# Patient Record
Sex: Male | Born: 1970 | Race: White | Hispanic: No | Smoking: Current every day smoker
Health system: Southern US, Community
[De-identification: ages and names within clinical notes are randomized; demographics above are authoritative.]

## PROBLEM LIST (undated history)

## (undated) DIAGNOSIS — Z87828 Personal history of other (healed) physical injury and trauma: Secondary | ICD-10-CM

## (undated) DIAGNOSIS — S065X9A Traumatic subdural hemorrhage with loss of consciousness of unspecified duration, initial encounter: Secondary | ICD-10-CM

## (undated) DIAGNOSIS — J189 Pneumonia, unspecified organism: Secondary | ICD-10-CM

## (undated) DIAGNOSIS — X789XXA Intentional self-harm by unspecified sharp object, initial encounter: Secondary | ICD-10-CM

## (undated) DIAGNOSIS — S060X9A Concussion with loss of consciousness of unspecified duration, initial encounter: Secondary | ICD-10-CM

## (undated) DIAGNOSIS — S060XAA Concussion with loss of consciousness status unknown, initial encounter: Secondary | ICD-10-CM

## (undated) DIAGNOSIS — G7111 Myotonic muscular dystrophy: Secondary | ICD-10-CM

## (undated) HISTORY — PX: INCISION AND DRAINAGE PERIRECTAL ABSCESS: SHX1804

---

## 2010-10-10 ENCOUNTER — Emergency Department (HOSPITAL_COMMUNITY)
Admission: EM | Admit: 2010-10-10 | Discharge: 2010-10-10 | Payer: Self-pay | Source: Home / Self Care | Admitting: Emergency Medicine

## 2011-02-17 DIAGNOSIS — S065XAA Traumatic subdural hemorrhage with loss of consciousness status unknown, initial encounter: Secondary | ICD-10-CM

## 2011-02-17 DIAGNOSIS — S065X9A Traumatic subdural hemorrhage with loss of consciousness of unspecified duration, initial encounter: Secondary | ICD-10-CM

## 2011-02-17 HISTORY — DX: Traumatic subdural hemorrhage with loss of consciousness status unknown, initial encounter: S06.5XAA

## 2011-02-17 HISTORY — DX: Traumatic subdural hemorrhage with loss of consciousness of unspecified duration, initial encounter: S06.5X9A

## 2011-03-18 ENCOUNTER — Emergency Department (HOSPITAL_COMMUNITY): Payer: Self-pay

## 2011-03-18 ENCOUNTER — Emergency Department (HOSPITAL_COMMUNITY)
Admission: EM | Admit: 2011-03-18 | Discharge: 2011-03-18 | Disposition: A | Discharge status: Other Acute Inpatient Hospital | Payer: Self-pay | Source: Home / Self Care | Attending: Emergency Medicine | Admitting: Emergency Medicine

## 2011-03-18 ENCOUNTER — Inpatient Hospital Stay (HOSPITAL_COMMUNITY)
Admission: EM | Admit: 2011-03-18 | Discharge: 2011-03-23 | DRG: 087 | Disposition: A | Discharge status: Home / Self Care | Payer: Self-pay | Attending: Emergency Medicine | Admitting: Emergency Medicine

## 2011-03-18 DIAGNOSIS — I252 Old myocardial infarction: Secondary | ICD-10-CM

## 2011-03-18 DIAGNOSIS — F17200 Nicotine dependence, unspecified, uncomplicated: Secondary | ICD-10-CM | POA: Diagnosis present

## 2011-03-18 DIAGNOSIS — S06300A Unspecified focal traumatic brain injury without loss of consciousness, initial encounter: Principal | ICD-10-CM | POA: Diagnosis present

## 2011-03-18 DIAGNOSIS — S02109A Fracture of base of skull, unspecified side, initial encounter for closed fracture: Principal | ICD-10-CM | POA: Diagnosis present

## 2011-03-18 LAB — DIFFERENTIAL
Basophils Relative: 0 % (ref 0–1)
Eosinophils Relative: 0 % (ref 0–5)
Lymphocytes Relative: 12 % (ref 12–46)
Lymphs Abs: 1.3 10*3/uL (ref 0.7–4.0)
Monocytes Relative: 6 % (ref 3–12)

## 2011-03-18 LAB — BASIC METABOLIC PANEL
GFR calc Af Amer: >60 mL/min (ref >60)
Sodium: 140 mEq/L (ref 135–145)

## 2011-03-18 LAB — URINALYSIS, ROUTINE W REFLEX MICROSCOPIC
Protein, ur: NEGATIVE mg/dL
Specific Gravity, Urine: 1.022 (ref 1.005–1.030)
Urobilinogen, UA: 1 mg/dL (ref 0.0–1.0)
pH: 5.5 (ref 5.0–8.0)

## 2011-03-18 LAB — CBC
HCT: 43.9 % (ref 39.0–52.0)
Hemoglobin: 14.1 g/dL (ref 13.0–17.0)
MCHC: 32.1 g/dL (ref 30.0–36.0)
MCV: 89 fL (ref 78.0–100.0)
Platelets: 199 10*3/uL (ref 150–400)
RDW: 13.5 % (ref 11.5–15.5)

## 2011-03-18 LAB — MRSA PCR SCREENING: MRSA by PCR: NEGATIVE

## 2011-03-19 ENCOUNTER — Inpatient Hospital Stay (HOSPITAL_COMMUNITY): Payer: Self-pay
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
## 2011-03-22 ENCOUNTER — Inpatient Hospital Stay (HOSPITAL_COMMUNITY): Payer: Self-pay

## 2011-03-23 ENCOUNTER — Inpatient Hospital Stay (HOSPITAL_COMMUNITY): Payer: Self-pay
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
## 2012-01-06 ENCOUNTER — Inpatient Hospital Stay (HOSPITAL_COMMUNITY)
Admission: EM | Admit: 2012-01-06 | Discharge: 2012-01-14 | DRG: 193 | Disposition: A | Discharge status: Home / Self Care | Payer: Self-pay | Attending: Internal Medicine | Admitting: Internal Medicine

## 2012-01-06 ENCOUNTER — Emergency Department (HOSPITAL_COMMUNITY): Payer: Self-pay

## 2012-01-06 ENCOUNTER — Encounter (HOSPITAL_COMMUNITY): Payer: Self-pay

## 2012-01-06 ENCOUNTER — Other Ambulatory Visit: Payer: Self-pay

## 2012-01-06 DIAGNOSIS — J189 Pneumonia, unspecified organism: Principal | ICD-10-CM | POA: Diagnosis present

## 2012-01-06 DIAGNOSIS — R0902 Hypoxemia: Secondary | ICD-10-CM | POA: Diagnosis present

## 2012-01-06 DIAGNOSIS — J9600 Acute respiratory failure, unspecified whether with hypoxia or hypercapnia: Secondary | ICD-10-CM | POA: Diagnosis present

## 2012-01-06 DIAGNOSIS — Z720 Tobacco use: Secondary | ICD-10-CM

## 2012-01-06 DIAGNOSIS — Z560 Unemployment, unspecified: Secondary | ICD-10-CM

## 2012-01-06 DIAGNOSIS — J479 Bronchiectasis, uncomplicated: Secondary | ICD-10-CM | POA: Diagnosis present

## 2012-01-06 DIAGNOSIS — Z6826 Body mass index (BMI) 26.0-26.9, adult: Secondary | ICD-10-CM

## 2012-01-06 DIAGNOSIS — F17200 Nicotine dependence, unspecified, uncomplicated: Secondary | ICD-10-CM | POA: Diagnosis present

## 2012-01-06 HISTORY — DX: Concussion with loss of consciousness status unknown, initial encounter: S06.0XAA

## 2012-01-06 HISTORY — DX: Pneumonia, unspecified organism: J18.9

## 2012-01-06 HISTORY — DX: Traumatic subdural hemorrhage with loss of consciousness of unspecified duration, initial encounter: S06.5X9A

## 2012-01-06 HISTORY — DX: Concussion with loss of consciousness of unspecified duration, initial encounter: S06.0X9A

## 2012-01-06 LAB — DIFFERENTIAL
Basophils Absolute: 0 10*3/uL (ref 0.0–0.1)
Eosinophils Relative: 1 % (ref 0–5)
Lymphocytes Relative: 10 % — ABNORMAL LOW (ref 12–46)
Neutro Abs: 7.3 10*3/uL (ref 1.7–7.7)

## 2012-01-06 LAB — CBC
HCT: 36.9 % — ABNORMAL LOW (ref 39.0–52.0)
Hemoglobin: 12.6 g/dL — ABNORMAL LOW (ref 13.0–17.0)
RBC: 4.16 MIL/uL — ABNORMAL LOW (ref 4.22–5.81)
WBC: 9.5 10*3/uL (ref 4.0–10.5)

## 2012-01-06 LAB — COMPREHENSIVE METABOLIC PANEL
ALT: 41 U/L (ref 0–53)
AST: 49 U/L — ABNORMAL HIGH (ref 0–37)
Albumin: 3 g/dL — ABNORMAL LOW (ref 3.5–5.2)
CO2: 30 mEq/L (ref 19–32)
Calcium: 9.4 mg/dL (ref 8.4–10.5)
GFR calc non Af Amer: >90 mL/min (ref >90)
Sodium: 141 mEq/L (ref 135–145)
Total Protein: 7.3 g/dL (ref 6.0–8.3)

## 2012-01-06 LAB — INFLUENZA PANEL BY PCR (TYPE A & B): H1N1 flu by pcr: NOT DETECTED

## 2012-01-06 LAB — MAGNESIUM: Magnesium: 2.1 mg/dL (ref 1.5–2.5)

## 2012-01-06 MED ORDER — ACETAMINOPHEN 500 MG PO TABS
1000.0000 mg | ORAL_TABLET | ORAL | Status: AC
  Administered 2012-01-06: 1000 mg via ORAL
  Filled 2012-01-06: qty 2

## 2012-01-06 MED ORDER — GUAIFENESIN-DM 100-10 MG/5ML PO SYRP
5.0000 mL | ORAL_SOLUTION | ORAL | Status: DC | PRN
  Administered 2012-01-07: 5 mL via ORAL
  Filled 2012-01-06 (×2): qty 5

## 2012-01-06 MED ORDER — DEXTROSE 5 % IV SOLN
500.0000 mg | INTRAVENOUS | Status: DC
  Administered 2012-01-07: 500 mg via INTRAVENOUS
  Filled 2012-01-06 (×2): qty 500

## 2012-01-06 MED ORDER — ACETAMINOPHEN 325 MG PO TABS: 650.0000 mg | ORAL_TABLET | Freq: Four times a day (QID) | ORAL | Status: DC | PRN

## 2012-01-06 MED ORDER — CEFTRIAXONE SODIUM 1 G IJ SOLR
1.0000 g | INTRAMUSCULAR | Status: DC
  Administered 2012-01-07 – 2012-01-08 (×2): 1 g via INTRAVENOUS
  Filled 2012-01-06 (×2): qty 10

## 2012-01-06 MED ORDER — ONDANSETRON HCL 4 MG/2ML IJ SOLN
4.0000 mg | Freq: Four times a day (QID) | INTRAMUSCULAR | Status: DC | PRN
  Administered 2012-01-07: 4 mg via INTRAVENOUS
  Filled 2012-01-06: qty 2

## 2012-01-06 MED ORDER — DEXTROSE 5 % IV SOLN
500.0000 mg | Freq: Once | INTRAVENOUS | Status: AC
  Administered 2012-01-06: 500 mg via INTRAVENOUS
  Filled 2012-01-06: qty 500

## 2012-01-06 MED ORDER — NICOTINE 21 MG/24HR TD PT24
21.0000 mg | MEDICATED_PATCH | TRANSDERMAL | Status: DC
  Administered 2012-01-09: 21 mg via TRANSDERMAL
  Filled 2012-01-06 (×9): qty 1

## 2012-01-06 MED ORDER — ENOXAPARIN SODIUM 40 MG/0.4ML ~~LOC~~ SOLN
40.0000 mg | SUBCUTANEOUS | Status: DC
  Administered 2012-01-06 – 2012-01-14 (×8): 40 mg via SUBCUTANEOUS
  Filled 2012-01-06 (×10): qty 0.4

## 2012-01-06 MED ORDER — ONDANSETRON HCL 4 MG/2ML IJ SOLN
4.0000 mg | Freq: Once | INTRAMUSCULAR | Status: AC
  Administered 2012-01-06: 4 mg via INTRAVENOUS
  Filled 2012-01-06: qty 2

## 2012-01-06 MED ORDER — ACETAMINOPHEN 650 MG RE SUPP: 650.0000 mg | Freq: Four times a day (QID) | RECTAL | Status: DC | PRN

## 2012-01-06 MED ORDER — DEXTROSE 5 % IV SOLN
1.0000 g | Freq: Once | INTRAVENOUS | Status: AC
  Administered 2012-01-06: 1 g via INTRAVENOUS
  Filled 2012-01-06: qty 10

## 2012-01-06 MED ORDER — ALBUTEROL SULFATE (5 MG/ML) 0.5% IN NEBU
2.5000 mg | INHALATION_SOLUTION | Freq: Four times a day (QID) | RESPIRATORY_TRACT | Status: DC | PRN
  Administered 2012-01-07: 2.5 mg via RESPIRATORY_TRACT

## 2012-01-06 MED ORDER — SODIUM CHLORIDE 0.9 % IV BOLUS (SEPSIS)
1000.0000 mL | Freq: Once | INTRAVENOUS | Status: AC
  Administered 2012-01-06: 1000 mL via INTRAVENOUS

## 2012-01-06 MED ORDER — ONDANSETRON HCL 4 MG PO TABS: 4.0000 mg | ORAL_TABLET | Freq: Four times a day (QID) | ORAL | Status: DC | PRN

## 2012-01-06 MED ORDER — SODIUM CHLORIDE 0.9 % IV SOLN
INTRAVENOUS | Status: DC
  Administered 2012-01-06: 20:00:00 via INTRAVENOUS
  Administered 2012-01-07: 100 mL/h via INTRAVENOUS
  Administered 2012-01-07 – 2012-01-08 (×2): via INTRAVENOUS

## 2012-01-06 NOTE — ED Notes (Addendum)
Pt was brought in by EMS with cough x 1 week and fever x 2 days. Pt claimed that he has been coughing up yellow phlegm. Pt also c/o SOB when coughing. Pt was given albuterol updraft treatment PTA

## 2012-01-06 NOTE — ED Notes (Signed)
Dinner tray ordered, regular diet 

## 2012-01-06 NOTE — ED Notes (Signed)
[id number] Ready

## 2012-01-06 NOTE — ED Provider Notes (Signed)
History   Nicholas Hanson is a 41 y/o M with no significant PMH who presents to the ED via ambulance for cough, fever, and SOB. The cough began 1 wk ago, is non productive, and occurs constantly throughout the day and night. It has gotten worse throughout the week, and he has not tried any medications. While he has had 4 episodes of bronchitis in the past, he has never had coughing like this episode. After coughing "episodes" he sometimes vomits and has a difficult time catching his breath. Vomiting is usually after coughing episodes, but he states he has vomiting without cough several times. Denies nausea, abd pain,or diarrhea. Fever began 2 days ago, Tmax at home 102. He lives at a shelter and states several people are sick with similar symptoms, but he is the worst. ROS + body aches (2 days), vertigo and dizziness when standing quickly, and sweating at night. Negative for headache and rash. His appetite is decreased, but he drinks liquids well. History obtained from Nicholas Hanson and his wife. Prior to this week, he was in his regular state of health.    CSN: [id number]  Arrival date & time 01/06/12  1191   First MD Initiated Contact with Patient 01/06/12 [phone]      Chief Complaint  Patient presents with  . Cough  . Fever    (Consider location/radiation/quality/duration/timing/severity/associated sxs/prior treatment) HPI  History reviewed. No pertinent past medical history.  History reviewed. No pertinent past surgical history.  No family history on file.  History  Substance Use Topics  . Smoking status: Current Everyday Smoker -- 1.0 packs/day  . Smokeless tobacco: Not on file  . Alcohol Use: No  Nicholas Hanson lives at a shelter and is currently homeless. He denies drinking, ever drinking or drug use. He smokes 1 pk a day, but since this illness has decreased to 6 cig a day    Review of Systems  Constitutional: Positive for fever, diaphoresis and appetite change. Negative for activity  change.  HENT: Negative for congestion and neck pain.   Eyes: Negative for pain.  Respiratory: Positive for cough and shortness of breath. Negative for choking, chest tightness, wheezing and stridor.   Cardiovascular: Negative for chest pain and leg swelling.  Gastrointestinal: Negative for abdominal distention.  Genitourinary: Negative for dysuria.  Musculoskeletal: Positive for arthralgias. Negative for back pain.  Neurological: Positive for dizziness and light-headedness. Negative for headaches.  Hematological: Negative for adenopathy.    Allergies  Review of patient's allergies indicates no known allergies.  Home Medications  No current outpatient prescriptions on file.  BP 127/65  Pulse 104  Temp(Src) 102.3 F (39.1 C) (Oral)  Resp 25  Ht 5\' 10"  (1.778 m)  Wt 182 lb (82.555 kg)  BMI 26.11 kg/m2  SpO2 95%  Physical Exam  Constitutional: He is oriented to person, place, and time. He appears well-developed.  HENT:  Head: Normocephalic.  Mouth/Throat: Mucous membranes are dry.  Eyes: Pupils are equal, round, and reactive to light.  Neck: Normal range of motion. No JVD present.  Cardiovascular: Normal rate, regular rhythm and normal heart sounds.  Exam reveals no gallop and no friction rub.   No murmur heard. Pulmonary/Chest: No stridor. Tachypnea noted. No respiratory distress. He has decreased breath sounds in the right upper field, the right middle field and the right lower field. He has no wheezes. He has rhonchi in the right lower field. He has no rales.  Abdominal: Soft. He exhibits no distension. There is no  tenderness.  Musculoskeletal: Normal range of motion.  Lymphadenopathy:    He has no cervical adenopathy.  Neurological: He is alert and oriented to person, place, and time.  Skin: Skin is warm and dry.  Psychiatric: His behavior is normal.    ED Course  Procedures (including critical care time) In the ED Nicholas Hanson was coughing, febrile, had O2  saturations in the high 80's with good wave form which was abnormal, tachycardic to 100's with BP 115/70's. In general he did not appear well. He was placed on [place] O2 and 1 L NS bolus was given and HR improved to 70/s. To r/u PNA (bacterial vs viral) and TB CXR was obtained. It was concerning for bilateral interstitial nodules consistent with atypical pna. I independently review the film. CBC, CMP, and flu test were ordered. Tylenol was given for fever. Ceftriaxone and Azithromycin started for empiric bacterial coverage of CAP.       Labs Reviewed  CBC  DIFFERENTIAL  COMPREHENSIVE METABOLIC PANEL   No results found.   No diagnosis found.    MDM  In the ED Nicholas Hanson was coughing, febrile with O2 saturations in the high 80's with good wave form. [place] O2 and 1 L NS bolus was given, and the patient improved. To r/u PNA (bacterial vs viral) and TB CXR was obtained. Low risk for PE per Wells criteria, so PE considered less likely.  CXR concerning for PNA, empiric bacterial coverage started (Ceftriaxone and Azithro). Other labs were unremarkable. Internal Medicine was called for admission        Gerhard Munch, MD 01/06/12 1305
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
## 2012-01-06 NOTE — ED Notes (Signed)
Dinner tray delivered.

## 2012-01-07 LAB — BASIC METABOLIC PANEL
BUN: 5 mg/dL — ABNORMAL LOW (ref 6–23)
Calcium: 9 mg/dL (ref 8.4–10.5)
Creatinine, Ser: 0.69 mg/dL (ref 0.50–1.35)
GFR calc Af Amer: >90 mL/min (ref >90)
GFR calc non Af Amer: >90 mL/min (ref >90)

## 2012-01-07 LAB — EXPECTORATED SPUTUM ASSESSMENT W GRAM STAIN, RFLX TO RESP C

## 2012-01-07 LAB — CBC
MCHC: 32.4 g/dL (ref 30.0–36.0)
RDW: 13 % (ref 11.5–15.5)

## 2012-01-07 LAB — LEGIONELLA ANTIGEN, URINE

## 2012-01-07 MED ORDER — PANTOPRAZOLE SODIUM 40 MG PO TBEC
40.0000 mg | DELAYED_RELEASE_TABLET | Freq: Every day | ORAL | Status: DC
  Administered 2012-01-08 – 2012-01-14 (×7): 40 mg via ORAL
  Filled 2012-01-07 (×7): qty 1

## 2012-01-07 MED ORDER — PREDNISONE 20 MG PO TABS
40.0000 mg | ORAL_TABLET | Freq: Two times a day (BID) | ORAL | Status: DC
  Administered 2012-01-07 – 2012-01-08 (×2): 40 mg via ORAL
  Filled 2012-01-07 (×5): qty 2

## 2012-01-07 MED ORDER — MORPHINE SULFATE 2 MG/ML IJ SOLN
0.5000 mg | Freq: Once | INTRAMUSCULAR | Status: AC
  Administered 2012-01-07: 0.5 mg via INTRAVENOUS

## 2012-01-07 MED ORDER — PANTOPRAZOLE SODIUM 40 MG IV SOLR
40.0000 mg | Freq: Once | INTRAVENOUS | Status: AC
  Administered 2012-01-07: 40 mg via INTRAVENOUS

## 2012-01-07 MED ORDER — ALBUTEROL SULFATE (5 MG/ML) 0.5% IN NEBU
INHALATION_SOLUTION | RESPIRATORY_TRACT | Status: AC
  Administered 2012-01-07: 2.5 mg via RESPIRATORY_TRACT
  Filled 2012-01-07: qty 0.5

## 2012-01-07 MED ORDER — SULFAMETHOXAZOLE-TMP DS 800-160 MG PO TABS
2.0000 | ORAL_TABLET | Freq: Three times a day (TID) | ORAL | Status: DC
  Administered 2012-01-07 – 2012-01-08 (×3): 2 via ORAL
  Filled 2012-01-07 (×7): qty 2

## 2012-01-07 MED ORDER — MORPHINE SULFATE 2 MG/ML IJ SOLN
INTRAMUSCULAR | Status: AC
  Administered 2012-01-07: 0.5 mg via INTRAVENOUS
  Filled 2012-01-07: qty 1

## 2012-01-07 MED ORDER — ALBUTEROL SULFATE (5 MG/ML) 0.5% IN NEBU
2.5000 mg | INHALATION_SOLUTION | RESPIRATORY_TRACT | Status: DC
  Administered 2012-01-07 – 2012-01-08 (×5): 2.5 mg via RESPIRATORY_TRACT
  Filled 2012-01-07 (×5): qty 0.5

## 2012-01-07 NOTE — Progress Notes (Signed)
O2 saturation 87%.  Nasal O2 on patient.  Oxygen increased to 3l/m and patient began heaving in the bathroom.  MD notified.  RN caring for patient giving Zofran IV to patient.  MD to see patient shortly.

## 2012-01-07 NOTE — H&P (Addendum)
Please see Dr Rowe Robert H&P for additional details.  Mr Nicholas Hanson is a 41 yo gentleman with no sig PMHx. He currently resides at [place] with his new wife. He had rather abrupt onset (one week) of URI sxs. He now has 2 days of fever, myalgias, and productive cough. Before getting ill, he has able to walk miles without DOE or having to stop to rest. He told Dr Scot Dock that he was last tested for HIV and was negative a few years ago and then told me he was negative 9 months ago. He states only two sexual contacts recently, neither of whom have HIV.   He met his new (2nd) wife three weeks ago and they married two weeks ago. 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 Is not working with anyone to get housing but plans to go to [place] in few weeks when wife can travel (??) where they can get some sort of housing for 3 months.   On exam : he desat down to 80's. O2 by [place] increased. Hoarse. Able to speak in full sentences.  HRRR L crackles B, even anteriorly. Neuro : A&O, no focal  CXR diffuse, B interstitial infiltrates.  WBC 9.5 despite Temp of 102.3 Alk phos 166  1. PNA - we are emperically treating for bacterial. Due to hypoxia & CXR appearance I will start Bactrim and Prednisone while checking HIV status. PT denies HIV RF but want to verify. Blood cxs, sputum gram stain and cxs, legionella, and strep. Temp has defervesce but still hypoxic. Will narrow tx as soon as test results in.  2. Tobacco use - cessation counseling  3. Homelessness - social work consult

## 2012-01-07 NOTE — Progress Notes (Signed)
71- RN to bedside during rounds.  Patient sleeping, sats 87% on 2 L [place].  Increased o2 to 3L, sats rose to 91%.  However, at this, patient began coughing violently, tore off 02, and began heaving in the bathroom.  Unable to place o2 back on patient as patient refusing.  Patient agitated, charging through room, demanding something for his cough.  MD paged.  Zofran given for heaving.  Morley Kos 01/07/2012

## 2012-01-07 NOTE — Progress Notes (Signed)
Medical Student Daily Progress Note  Subjective: The patient had a coughing fit this morning, and desatted down to 87% while on 2L of oxygen.  When increased to 3L by nasal canuli, he apparently started having a coughing fit again. He denies chest pain, but states this uncontrollable coughing spell is like it was at home, with spit up that is clear/foamy. He is tossing and turning, getting up and sitting down and coughing rather uncontrollably when we talk to him in his room.  Denies CP.  Per nursing, the patient was charging across the room during this coughing fit.  He did not receive any breathing treatment or mucinex overnight.  His symptoms improved with nebs and was satting 95% on room air after tx.  Objective: Vital signs in last 24 hours: Filed Vitals:   01/06/12 2100 01/07/12 0500 01/07/12 0800 01/07/12 0856  BP: 108/66 101/58    Pulse: 72 69 58   Temp: 97.7 F (36.5 C) 98.9 F (37.2 C)    TempSrc: Oral Oral    Resp: 20 18    Height:      Weight:      SpO2: 87% 87% 91% 95%   Weight change:   Intake/Output Summary (Last 24 hours) at 01/07/12 1036 Last data filed at 01/07/12 6213  Gross per 24 hour  Intake   1391 ml  Output    450 ml  Net    941 ml   Physical Exam: BP 101/58  Pulse 58  Temp(Src) 98.9 F (37.2 C) (Oral)  Resp 18  Ht 5\' 10"  (1.778 m)  Wt 84.1 kg (185 lb 6.5 oz)  BMI 26.60 kg/m2  SpO2 95%  General Appearance:    Alert, in acute distress, appears stated age  Head:    Normocephalic, without obvious abnormality, atraumatic     Ears:    Left TM is sclerosed.  Nose:   Nares normal, septum midline, mucosa normal, no drainage    or sinus tenderness  Throat:   Lips, mucosa, and tongue normal; teeth and gums normal  Neck:   Supple, symmetrical, trachea midline, no adenopathy;       thyroid:  No enlargement/tenderness/nodules; no carotid   bruit or JVD  Back:     Symmetric, no curvature, ROM normal, no CVA tenderness  Lungs:     Labored breathing,  expiratory wheezing bilaterally, no crackles, rales on exam  Chest wall:    No tenderness or deformity  Heart:    Regular rate and rhythm, S1 and S2 normal, no murmur, rub   or gallop  Abdomen:     Soft, non-tender, bowel sounds active all four quadrants,    no masses, no organomegaly  Genitalia:    Normal male without lesion, discharge or tenderness  Rectal:    Normal tone, normal prostate, no masses or tenderness;   guaiac negative stool  Extremities:   Extremities normal, atraumatic, no cyanosis or edema  Pulses:   2+ and symmetric all extremities  Skin:   Skin color, texture, turgor normal, no rashes or lesions  Lymph nodes:   Cervical, supraclavicular, and axillary nodes normal  Neurologic:   CNII-XII intact. Normal strength, sensation and reflexes      throughout   Lab Results: Basic Metabolic Panel:  Lab 01/07/12 0865 01/06/12 2150 01/06/12 0828  NA 143 -- 141  K 4.4 -- 4.5  CL 109 -- 104  CO2 28 -- 30  GLUCOSE 91 -- 115*  BUN 5* -- 8  CREATININE  0.69 -- 0.78  CALCIUM 9.0 -- 9.4  MG -- 2.1 --  PHOS -- -- --   Liver Function Tests:  Lab 01/06/12 0828  AST 49*  ALT 41  ALKPHOS 166*  BILITOT 0.7  PROT 7.3  ALBUMIN 3.0*   No results found for this basename: LIPASE:2,AMYLASE:2 in the last 168 hours No results found for this basename: AMMONIA:2 in the last 168 hours CBC:  Lab 01/07/12 0630 01/06/12 0828  WBC 9.6 9.5  NEUTROABS -- 7.3  HGB 11.0* 12.6*  HCT 33.9* 36.9*  MCV 90.4 88.7  PLT 226 232   Cardiac Enzymes: No results found for this basename: CKTOTAL:3,CKMB:3,CKMBINDEX:3,TROPONINI:3 in the last 168 hours BNP: No results found for this basename: PROBNP:3 in the last 168 hours D-Dimer: No results found for this basename: DDIMER:2 in the last 168 hours CBG: No results found for this basename: GLUCAP:6 in the last 168 hours Hemoglobin A1C: No results found for this basename: HGBA1C in the last 168 hours Fasting Lipid Panel: No results found for this  basename: CHOL,HDL,LDLCALC,TRIG,CHOLHDL,LDLDIRECT in the last 161 hours Thyroid Function Tests: No results found for this basename: TSH,T4TOTAL,FREET4,T3FREE,THYROIDAB in the last 168 hours Coagulation: No results found for this basename: LABPROT:4,INR:4 in the last 168 hours Anemia Panel: No results found for this basename: VITAMINB12,FOLATE,FERRITIN,TIBC,IRON,RETICCTPCT in the last 168 hours Urine Drug Screen: Drugs of Abuse  No results found for this basename: labopia,  cocainscrnur,  labbenz,  amphetmu,  thcu,  labbarb    Alcohol Level: No results found for this basename: ETH:2 in the last 168 hours Urinalysis: No results found for this basename: COLORURINE:2,APPERANCEUR:2,LABSPEC:2,PHURINE:2,GLUCOSEU:2,HGBUR:2,BILIRUBINUR:2,KETONESUR:2,PROTEINUR:2,UROBILINOGEN:2,NITRITE:2,LEUKOCYTESUR:2 in the last 168 hours Misc. Labs:  Micro Results: Recent Results (from the past 240 hour(s))  CULTURE, SPUTUM-ASSESSMENT     Status: Normal   Collection Time   01/07/12  1:00 AM      Component Value Range Status Comment   Specimen Description SPUTUM   Final    Special Requests NONE   Final    Sputum evaluation     Final    Value: THIS SPECIMEN IS ACCEPTABLE. RESPIRATORY CULTURE REPORT TO FOLLOW.   Report Status 01/07/2012 FINAL   Final   CULTURE, RESPIRATORY     Status: Normal (Preliminary result)   Collection Time   01/07/12  1:00 AM      Component Value Range Status Comment   Specimen Description SPUTUM   Final    Special Requests NONE   Final    Gram Stain     Final    Value: NO WBC SEEN     RARE SQUAMOUS EPITHELIAL CELLS PRESENT     NO ORGANISMS SEEN   Culture PENDING   Incomplete    Report Status PENDING   Incomplete    Studies/Results: Dg Chest 2 View  01/06/2012  *RADIOLOGY REPORT*  Clinical Data: Cough, congestion, fever  CHEST - 2 VIEW  Comparison: None.  Findings: There are coarsely prominent interstitial markings and some nodularity throughout the mid and lower lung fields.   This may represent pneumonia possibly viral or atypical.  No effusion is seen.  The heart is within normal limits in size.  No bony abnormality is noted.  IMPRESSION: Prominent coarse interstitial markings with some nodularity suspicious for pneumonia possibly atypical or viral.  Original Report Authenticated By: Juline Patch, M.D.   Medications: I have reviewed the patient's current medications. Scheduled Meds:    . albuterol  2.5 mg Nebulization Q4H  . azithromycin  500 mg Intravenous  Once  . azithromycin  500 mg Intravenous Q24H  . cefTRIAXone (ROCEPHIN)  IV  1 g Intravenous Q24H  . enoxaparin  40 mg Subcutaneous Q24H  .  morphine injection  0.5 mg Intravenous Once  . nicotine  21 mg Transdermal Q24H  . pantoprazole  40 mg Oral Q1200  . pantoprazole (PROTONIX) IV  40 mg Intravenous Once  . predniSONE  40 mg Oral BID WC  . sulfamethoxazole-trimethoprim  2 tablet Oral Q8H   Continuous Infusions:    . sodium chloride 100 mL/hr at 01/07/12 0625   PRN Meds:.acetaminophen, acetaminophen, guaiFENesin-dextromethorphan, ondansetron (ZOFRAN) IV, ondansetron, DISCONTD: albuterol  Assessment/Plan: Problem list.  Community acquired Pneumonia  Fever  Hypoalbuminemia (3.0)  Mild transaminitis (AST 49)  Alk phos of 166  Tobacco Abuse   Mr. Rueth is a 41 year old who presents with cough with sputum production, fever of >102 and a physical exam and chest xray consistent with findings of community acquired pneumonia. This is likely community acquired viral vs. Atypical pneumonia, but the differential includes bacterial pneumonia, TB (due to housing in group [facility]), and bronchitis. Also PCP is possible given CXR appearance.  # Pneumonia, Community Acquired - The presentation is most likely c/w CAP, but we can not rule out PCP as the patient seems to have worsening oxygen saturation, elevated alkaline phosphatase and non-specific CXR.  - Started Q4H nebs for  hypoxia - Started on Bactrim 2 tabs TID for PCP risk, started on Prednisone 40mg  BID - HIV Pending - Started on Protonix 40mg  QD - Antibiotic therapy - IV Ceftriaxone 1g QD and IV Azithromycin 500mg  QD - 2L O2 by nasal canuli to maintain oxygen saturation over 92% - Influenza panel negative - Legionella urine antigen pending - Strep pneumonia antigen negative - Albuterol PRN wheezing/SOB - BMET and CBC tomorrow AM - Blood cultures pending (post ABx tx) - Vitals per floor  # Fever - The patient's fever is likely due to the CAP, and it has resolved this morning - pt afebrile   - Tylenol PRN fever >101.  # Tobacco Abuse - Counseled on smoking cessation, although patient remains adament that he will continue smoking.   - Nicotine patch, consult for cessation.  # Diet/Fluids - Regular Diet, 100cc/hr NS  # VTE prohpylaxis - Lovenox QD  # Disposition - If symptoms don't improve, consider moving the patient to stepdown.  SW called (Thanks Trula Ore!) and is able to provide info on local resources for the patient given his current homeless status.   LOS: 1 day   This is a Psychologist, occupational Note.  The care of the patient was discussed with Dr. Scot Dock and the assessment and plan formulated with their assistance.  Please see their attached note for official documentation of the daily encounter.  Nicholas Hanson 01/07/2012, 10:36 AM    Resident Co-sign Daily Note: I have seen the patient and reviewed the daily progress note by MS 4 and discussed the care of the patient with them.  See below for documentation of my findings, assessment, and plans.  Subjective: Coughing spell this am with O2 sat 87%. No chest pain or other complaint. Has not received nebs since last night    Objective: Vital signs in last 24 hours: Filed Vitals:   01/06/12 2100 01/07/12 0500 01/07/12 0800 01/07/12 0856  BP: 108/66 101/58    Pulse: 72 69 58   Temp: 97.7 F (36.5 C) 98.9 F (37.2 C)  TempSrc: Oral Oral    Resp: 20 18    Height:      Weight:      SpO2: 87% 87% 91% 95%   Physical Exam:  Gen- agitated, respi distress from coughing and sputum production CVS- tachycardia Respi- b/l equal, mild wheezing on upper lung fields Abd- s/nt/nd Ext- no edema Neuro- non focal  Lab Results: Reviewed and documented in Electronic Record Micro Results: Reviewed and documented in Electronic Record Studies/Results: Reviewed and documented in Electronic Record Medications: I have reviewed the patient's current medications. Scheduled Meds:   . albuterol  2.5 mg Nebulization Q4H  . azithromycin  500 mg Intravenous Once  . azithromycin  500 mg Intravenous Q24H  . cefTRIAXone (ROCEPHIN)  IV  1 g Intravenous Q24H  . enoxaparin  40 mg Subcutaneous Q24H  .  morphine injection  0.5 mg Intravenous Once  . nicotine  21 mg Transdermal Q24H  . pantoprazole  40 mg Oral Q1200  . pantoprazole (PROTONIX) IV  40 mg Intravenous Once  . predniSONE  40 mg Oral BID WC  . sulfamethoxazole-trimethoprim  2 tablet Oral Q8H   Continuous Infusions:   . sodium chloride 100 mL/hr at 01/07/12 0625   PRN Meds:.acetaminophen, acetaminophen, guaiFENesin-dextromethorphan, ondansetron (ZOFRAN) IV, ondansetron, DISCONTD: albuterol Assessment/Plan:  Acute episode this am may be mucous plugging vs brochospasm. PCP is another consideration now that patient has not improved. Checking HIV and treating empirically with bactrim and prednsione. Change nebs to q4 schedule. Add ppi. Continue other treatment as before.    LOS: 1 day   Nicholas Hanson 01/07/2012, 11:04 AM

## 2012-01-08 MED ORDER — ALBUTEROL SULFATE (5 MG/ML) 0.5% IN NEBU
2.5000 mg | INHALATION_SOLUTION | Freq: Four times a day (QID) | RESPIRATORY_TRACT | Status: DC
  Administered 2012-01-08 – 2012-01-12 (×19): 2.5 mg via RESPIRATORY_TRACT
  Filled 2012-01-08 (×17): qty 0.5

## 2012-01-08 MED ORDER — ALBUTEROL SULFATE 2 MG/5ML PO SYRP: 2.5000 mg | ORAL_SOLUTION | ORAL | Status: DC | PRN

## 2012-01-08 MED ORDER — LEVOFLOXACIN 750 MG PO TABS
750.0000 mg | ORAL_TABLET | Freq: Every day | ORAL | Status: DC
  Administered 2012-01-08 – 2012-01-14 (×7): 750 mg via ORAL
  Filled 2012-01-08 (×7): qty 1

## 2012-01-08 MED ORDER — ALBUTEROL SULFATE (5 MG/ML) 0.5% IN NEBU: 2.5000 mg | INHALATION_SOLUTION | RESPIRATORY_TRACT | Status: DC | PRN

## 2012-01-08 NOTE — Progress Notes (Signed)
Subjective: He reports having coughing spell last evening that lasting for 1 hour but reports feeling better now. No more coughing spells for now. Denies any SOB, hemoptysis.   Objective: Vital signs in last 24 hours: Filed Vitals:   01/08/12 0051 01/08/12 0455 01/08/12 0500 01/08/12 0733  BP:   123/58   Pulse:   61   Temp:   97.3 F (36.3 C)   TempSrc:   Oral   Resp:   18   Height:      Weight:      SpO2: 93% 88% 94% 95%   Weight change:   Intake/Output Summary (Last 24 hours) at 01/08/12 1610 Last data filed at 01/08/12 0500  Gross per 24 hour  Intake 758.33 ml  Output   1900 ml  Net -1141.67 ml   Physical Exam: BP 123/58  Pulse 61  Temp(Src) 97.3 F (36.3 C) (Oral)  Resp 18  Ht 5\' 10"  (1.778 m)  Wt 185 lb 6.5 oz (84.1 kg)  BMI 26.60 kg/m2  SpO2 95%  General Appearance:    Alert, cooperative, no distress, appears stated age  Head:    Normocephalic, without obvious abnormality, atraumatic  Eyes:    PERRL, conjunctiva/corneas clear, EOM's intact, fundi    benign, both eyes       Ears:    Normal TM's and external ear canals, both ears  Nose:   Nares normal, septum midline, mucosa normal, no drainage    or sinus tenderness  Throat:   Lips, mucosa, and tongue normal; teeth and gums normal  Neck:   Supple, symmetrical, trachea midline, no adenopathy;       thyroid:  No enlargement/tenderness/nodules; no carotid   bruit or JVD  Back:     Symmetric, no curvature, ROM normal, no CVA tenderness  Lungs:     Clear to auscultation bilaterally, respirations unlabored, bilateral basilar crackles present  Chest wall:    No tenderness or deformity  Heart:    Regular rate and rhythm, S1 and S2 normal, no murmur, rub   or gallop  Abdomen:     Soft, non-tender, bowel sounds active all four quadrants,    no masses, no organomegaly  Genitalia:    Normal male without lesion, discharge or tenderness  Rectal:    Normal tone, normal prostate, no masses or tenderness;   guaiac  negative stool  Extremities:   Extremities normal, atraumatic, no cyanosis or edema  Pulses:   2+ and symmetric all extremities  Skin:   Skin color, texture, turgor normal, no rashes or lesions  Lymph nodes:   Cervical, supraclavicular, and axillary nodes normal  Neurologic:   CNII-XII intact. Normal strength, sensation and reflexes      throughout   Lab Results: Basic Metabolic Panel:  Lab 01/07/12 9604 01/06/12 2150 01/06/12 0828  NA 143 -- 141  K 4.4 -- 4.5  CL 109 -- 104  CO2 28 -- 30  GLUCOSE 91 -- 115*  BUN 5* -- 8  CREATININE 0.69 -- 0.78  CALCIUM 9.0 -- 9.4  MG -- 2.1 --  PHOS -- -- --   Liver Function Tests:  Lab 01/06/12 0828  AST 49*  ALT 41  ALKPHOS 166*  BILITOT 0.7  PROT 7.3  ALBUMIN 3.0*   No results found for this basename: LIPASE:2,AMYLASE:2 in the last 168 hours No results found for this basename: AMMONIA:2 in the last 168 hours CBC:  Lab 01/07/12 0630 01/06/12 0828  WBC 9.6 9.5  NEUTROABS --  7.3  HGB 11.0* 12.6*  HCT 33.9* 36.9*  MCV 90.4 88.7  PLT 226 232   Cardiac Enzymes: No results found for this basename: CKTOTAL:3,CKMB:3,CKMBINDEX:3,TROPONINI:3 in the last 168 hours BNP: No results found for this basename: PROBNP:3 in the last 168 hours D-Dimer: No results found for this basename: DDIMER:2 in the last 168 hours CBG: No results found for this basename: GLUCAP:6 in the last 168 hours Hemoglobin A1C: No results found for this basename: HGBA1C in the last 168 hours Fasting Lipid Panel: No results found for this basename: CHOL,HDL,LDLCALC,TRIG,CHOLHDL,LDLDIRECT in the last 161 hours Thyroid Function Tests: No results found for this basename: TSH,T4TOTAL,FREET4,T3FREE,THYROIDAB in the last 168 hours Coagulation: No results found for this basename: LABPROT:4,INR:4 in the last 168 hours Anemia Panel: No results found for this basename: VITAMINB12,FOLATE,FERRITIN,TIBC,IRON,RETICCTPCT in the last 168 hours Urine Drug Screen: Drugs of  Abuse  No results found for this basename: labopia, cocainscrnur, labbenz, amphetmu, thcu, labbarb    Alcohol Level: No results found for this basename: ETH:2 in the last 168 hours Urinalysis: No results found for this basename: COLORURINE:2,APPERANCEUR:2,LABSPEC:2,PHURINE:2,GLUCOSEU:2,HGBUR:2,BILIRUBINUR:2,KETONESUR:2,PROTEINUR:2,UROBILINOGEN:2,NITRITE:2,LEUKOCYTESUR:2 in the last 168 hours   Micro Results: Recent Results (from the past 240 hour(s))  CULTURE, SPUTUM-ASSESSMENT     Status: Normal   Collection Time   01/07/12  1:00 AM      Component Value Range Status Comment   Specimen Description SPUTUM   Final    Special Requests NONE   Final    Sputum evaluation     Final    Value: THIS SPECIMEN IS ACCEPTABLE. RESPIRATORY CULTURE REPORT TO FOLLOW.   Report Status 01/07/2012 FINAL   Final   CULTURE, RESPIRATORY     Status: Normal (Preliminary result)   Collection Time   01/07/12  1:00 AM      Component Value Range Status Comment   Specimen Description SPUTUM   Final    Special Requests NONE   Final    Gram Stain     Final    Value: NO WBC SEEN     RARE SQUAMOUS EPITHELIAL CELLS PRESENT     NO ORGANISMS SEEN   Culture PENDING   Incomplete    Report Status PENDING   Incomplete    Studies/Results: Dg Chest 2 View  01/06/2012  *RADIOLOGY REPORT*  Clinical Data: Cough, congestion, fever  CHEST - 2 VIEW  Comparison: None.  Findings: There are coarsely prominent interstitial markings and some nodularity throughout the mid and lower lung fields.  This may represent pneumonia possibly viral or atypical.  No effusion is seen.  The heart is within normal limits in size.  No bony abnormality is noted.  IMPRESSION: Prominent coarse interstitial markings with some nodularity suspicious for pneumonia possibly atypical or viral.  Original Report Authenticated By: Juline Patch, M.D.   Medications: I have reviewed the patient's current medications. Scheduled Meds:   . albuterol  2.5 mg  Nebulization QID  . azithromycin  500 mg Intravenous Q24H  . cefTRIAXone (ROCEPHIN)  IV  1 g Intravenous Q24H  . enoxaparin  40 mg Subcutaneous Q24H  .  morphine injection  0.5 mg Intravenous Once  . nicotine  21 mg Transdermal Q24H  . pantoprazole  40 mg Oral Q1200  . pantoprazole (PROTONIX) IV  40 mg Intravenous Once  . predniSONE  40 mg Oral BID WC  . sulfamethoxazole-trimethoprim  2 tablet Oral Q8H  . DISCONTD: albuterol  2.5 mg Nebulization Q4H   Continuous Infusions:   . sodium chloride 100  mL/hr at 01/08/12 0627   PRN Meds:.acetaminophen, acetaminophen, albuterol, guaiFENesin-dextromethorphan, ondansetron (ZOFRAN) IV, ondansetron, DISCONTD: albuterol, DISCONTD: albuterol, DISCONTD: albuterol Assessment/Plan:   1. Community acquired pneumonia: Atypical vs viral. PCP was on our differential but with HIV being negative, it is unlikely . He continues to be afebrile but  desatted to low 80's last night so we will observe him for 24 hours on PO antibiotics. His urine for legionella and streptococcal antigen was negative. Sputum gram stain shows normal  oropharyngeal flora. Plan:  D/C IV ceftriaxone and Zithromax. D/C bactrim and steroids. Start him on Levaquin. Continue mucinex and PRN albuterol for symptomatic relief.    2. Tobacco abuse: Counseled him on smoking cessation.   Continue nicotine patch.  3. DVT: Lovenox.   LOS: 2 days   Nicholas Hanson 01/08/2012, 8:26 AM

## 2012-01-08 NOTE — Progress Notes (Signed)
Clinical Social Work-CSW attempting to rally resources for pt-received referral-unable to meet with pt however relayed to weekend coverage-please contact weekend coverage Dellie Burns with any questions and assistance at time of d/c if over [employer], [phone]

## 2012-01-08 NOTE — Progress Notes (Signed)
RT came into room and patient as sleeping with oxygen off.  Sats were 82 on RA.  RT placed patient back on 4L [place] and sats returned to 95%

## 2012-01-09 LAB — CULTURE, RESPIRATORY W GRAM STAIN: Gram Stain: NONE SEEN

## 2012-01-09 NOTE — Progress Notes (Signed)
Subjective: He still reports having some coughing spells and states that he became really SOB when he went outside for few minutes last night. He states that I am not ready to go yet.  Objective: Vital signs in last 24 hours: Filed Vitals:   01/08/12 1400 01/08/12 1931 01/08/12 2252 01/09/12 0500  BP: 113/54  126/69 104/53  Pulse: 69  70 53  Temp: 97 F (36.1 C)  97.1 F (36.2 C) 97.4 F (36.3 C)  TempSrc: Oral  Oral Oral  Resp: 17  18 18   Height:      Weight:      SpO2: 92% 96% 94% 87%   Weight change:   Intake/Output Summary (Last 24 hours) at 01/09/12 0720 Last data filed at 01/08/12 2253  Gross per 24 hour  Intake    960 ml  Output   1150 ml  Net   -190 ml   Physical Exam: BP 104/53  Pulse 53  Temp(Src) 97.4 F (36.3 C) (Oral)  Resp 18  Ht 5\' 10"  (1.778 m)  Wt 185 lb 6.5 oz (84.1 kg)  BMI 26.60 kg/m2  SpO2 87%  General Appearance:    Alert, cooperative, no distress, appears stated age  Head:    Normocephalic, without obvious abnormality, atraumatic  Eyes:    PERRL, conjunctiva/corneas clear, EOM's intact, fundi    benign, both eyes       Ears:    Normal TM's and external ear canals, both ears  Nose:   Nares normal, septum midline, mucosa normal, no drainage    or sinus tenderness  Throat:   Lips, mucosa, and tongue normal; teeth and gums normal  Neck:   Supple, symmetrical, trachea midline, no adenopathy;       thyroid:  No enlargement/tenderness/nodules; no carotid   bruit or JVD  Back:     Symmetric, no curvature, ROM normal, no CVA tenderness  Lungs:     Clear to auscultation bilaterally, respirations unlabored, bilateral basilar crackles present  Chest wall:    No tenderness or deformity  Heart:    Regular rate and rhythm, S1 and S2 normal, no murmur, rub   or gallop  Abdomen:     Soft, non-tender, bowel sounds active all four quadrants,    no masses, no organomegaly  Genitalia:    Normal male without lesion, discharge or tenderness  Rectal:    Normal  tone, normal prostate, no masses or tenderness;   guaiac negative stool  Extremities:   Extremities normal, atraumatic, no cyanosis or edema  Pulses:   2+ and symmetric all extremities  Skin:   Skin color, texture, turgor normal, no rashes or lesions  Lymph nodes:   Cervical, supraclavicular, and axillary nodes normal  Neurologic:   CNII-XII intact. Normal strength, sensation and reflexes      throughout   Lab Results: Basic Metabolic Panel:  Lab 01/07/12 1610 01/06/12 2150 01/06/12 0828  NA 143 -- 141  K 4.4 -- 4.5  CL 109 -- 104  CO2 28 -- 30  GLUCOSE 91 -- 115*  BUN 5* -- 8  CREATININE 0.69 -- 0.78  CALCIUM 9.0 -- 9.4  MG -- 2.1 --  PHOS -- -- --   Liver Function Tests:  Lab 01/06/12 0828  AST 49*  ALT 41  ALKPHOS 166*  BILITOT 0.7  PROT 7.3  ALBUMIN 3.0*   No results found for this basename: LIPASE:2,AMYLASE:2 in the last 168 hours No results found for this basename: AMMONIA:2 in the last  168 hours CBC:  Lab 01/07/12 0630 01/06/12 0828  WBC 9.6 9.5  NEUTROABS -- 7.3  HGB 11.0* 12.6*  HCT 33.9* 36.9*  MCV 90.4 88.7  PLT 226 232   Cardiac Enzymes: No results found for this basename: CKTOTAL:3,CKMB:3,CKMBINDEX:3,TROPONINI:3 in the last 168 hours BNP: No results found for this basename: PROBNP:3 in the last 168 hours D-Dimer: No results found for this basename: DDIMER:2 in the last 168 hours CBG: No results found for this basename: GLUCAP:6 in the last 168 hours Hemoglobin A1C: No results found for this basename: HGBA1C in the last 168 hours Fasting Lipid Panel: No results found for this basename: CHOL,HDL,LDLCALC,TRIG,CHOLHDL,LDLDIRECT in the last 161 hours Thyroid Function Tests: No results found for this basename: TSH,T4TOTAL,FREET4,T3FREE,THYROIDAB in the last 168 hours Coagulation: No results found for this basename: LABPROT:4,INR:4 in the last 168 hours Anemia Panel: No results found for this basename:  VITAMINB12,FOLATE,FERRITIN,TIBC,IRON,RETICCTPCT in the last 168 hours Urine Drug Screen: Drugs of Abuse  No results found for this basename: labopia,  cocainscrnur,  labbenz,  amphetmu,  thcu,  labbarb    Alcohol Level: No results found for this basename: ETH:2 in the last 168 hours Urinalysis: No results found for this basename: COLORURINE:2,APPERANCEUR:2,LABSPEC:2,PHURINE:2,GLUCOSEU:2,HGBUR:2,BILIRUBINUR:2,KETONESUR:2,PROTEINUR:2,UROBILINOGEN:2,NITRITE:2,LEUKOCYTESUR:2 in the last 168 hours   Micro Results: Recent Results (from the past 240 hour(s))  CULTURE, SPUTUM-ASSESSMENT     Status: Normal   Collection Time   01/07/12  1:00 AM      Component Value Range Status Comment   Specimen Description SPUTUM   Final    Special Requests NONE   Final    Sputum evaluation     Final    Value: THIS SPECIMEN IS ACCEPTABLE. RESPIRATORY CULTURE REPORT TO FOLLOW.   Report Status 01/07/2012 FINAL   Final   CULTURE, RESPIRATORY     Status: Normal (Preliminary result)   Collection Time   01/07/12  1:00 AM      Component Value Range Status Comment   Specimen Description SPUTUM   Final    Special Requests NONE   Final    Gram Stain     Final    Value: NO WBC SEEN     RARE SQUAMOUS EPITHELIAL CELLS PRESENT     NO ORGANISMS SEEN   Culture NORMAL OROPHARYNGEAL FLORA   Final    Report Status PENDING   Incomplete    Studies/Results: No results found. Medications: I have reviewed the patient's current medications. Scheduled Meds:    . albuterol  2.5 mg Nebulization QID  . enoxaparin  40 mg Subcutaneous Q24H  . levofloxacin  750 mg Oral Daily  . nicotine  21 mg Transdermal Q24H  . pantoprazole  40 mg Oral Q1200  . DISCONTD: azithromycin  500 mg Intravenous Q24H  . DISCONTD: cefTRIAXone (ROCEPHIN)  IV  1 g Intravenous Q24H  . DISCONTD: predniSONE  40 mg Oral BID WC  . DISCONTD: sulfamethoxazole-trimethoprim  2 tablet Oral Q8H   Continuous Infusions:    . DISCONTD: sodium chloride 100 mL/hr  at 01/08/12 0700   PRN Meds:.acetaminophen, acetaminophen, albuterol, guaiFENesin-dextromethorphan, ondansetron (ZOFRAN) IV, ondansetron Assessment/Plan:   1. Community acquired pneumonia: Atypical vs viral. PCP was on our differential but with HIV being negative, it is unlikely . He continues to be afebrile but is requiring O2 by nasal canula( satting in  low 90% on 3 litres),  so we will observe him  on PO antibiotics. His urine for legionella and streptococcal antigen was negative. Sputum gram stain shows normal  oropharyngeal  flora. Plan:  .Continue  on Levaquin. Continue mucinex and PRN albuterol for symptomatic relief.    2. Tobacco abuse: Counseled him on smoking cessation.   Continue nicotine patch.  3. DVT: Lovenox.   LOS: 3 days   Nicholas Hanson 01/09/2012, 7:20 AM

## 2012-01-09 NOTE — Progress Notes (Signed)
Clinical Social Work Department BRIEF PSYCHOSOCIAL ASSESSMENT 01/09/2012  Patient:  Nicholas Hanson, Nicholas Hanson     Account Number:  [id number]     Admit date:  01/06/2012  Clinical Social Worker:  Skip Mayer  Date/Time:  01/09/2012 11:10 AM  Referred by:  Physician  Date Referred:  01/08/2012 Referred for  Homelessness   Other Referral:   Interview type:  Patient Other interview type:    PSYCHOSOCIAL DATA Living Status:  WIFE Admitted from facility:   Level of care:   Primary support name:  Tresa Primary support relationship to patient:  SPOUSE Degree of support available:   Adequate per pt    CURRENT CONCERNS Current Concerns  Other - See comment   Other Concerns:   Homelessness/transportation    SOCIAL WORK ASSESSMENT / PLAN CSW met with pt re: homelessness. Pt reports he and his wife have been living at [facility] for 3 days PTA. Pt reports [facility] will hold beds until pt released from hospital. CSW provided additional homeless resources and bus passes to be used at time of d/c. RN aware CSW provided resources and bus passes. No other CSW needs reported or noted. CSW signin goff.   Assessment/plan status:  Information/Referral to [facility] Other assessment/ plan:   Information/referral to community resources:   [facility] [employer] for financial and employment resources.    PATIENT'S/FAMILY'S RESPONSE TO PLAN OF CARE: Pt verbalized understanding that bus passes are for use at time of d/c. Pt agreeable to return to [facility] at time of d/c.        Dellie Burns, MSW, [place] [phone] (weekend)

## 2012-01-10 ENCOUNTER — Inpatient Hospital Stay (HOSPITAL_COMMUNITY): Payer: Self-pay

## 2012-01-10 LAB — CBC
HCT: 35 % — ABNORMAL LOW (ref 39.0–52.0)
Hemoglobin: 11.5 g/dL — ABNORMAL LOW (ref 13.0–17.0)
MCH: 29.9 pg (ref 26.0–34.0)
MCHC: 32.9 g/dL (ref 30.0–36.0)
MCV: 90.9 fL (ref 78.0–100.0)

## 2012-01-10 LAB — BASIC METABOLIC PANEL
BUN: 10 mg/dL (ref 6–23)
Calcium: 9.2 mg/dL (ref 8.4–10.5)
Creatinine, Ser: 0.74 mg/dL (ref 0.50–1.35)
GFR calc non Af Amer: >90 mL/min (ref >90)
Glucose, Bld: 91 mg/dL (ref 70–99)
Potassium: 4 mEq/L (ref 3.5–5.1)

## 2012-01-10 NOTE — Progress Notes (Signed)
Subjective: He was not able to get good night sleep because of coughing and therefore did not talk to me much this AM.  Objective: Vital signs in last 24 hours: Filed Vitals:   01/09/12 1605 01/09/12 1608 01/09/12 2131 01/10/12 0521  BP: 100/58  112/66 91/58  Pulse: 64  72 60  Temp: 96.9 F (36.1 C)  97 F (36.1 C) 98.2 F (36.8 C)  TempSrc: Oral  Oral Axillary  Resp: 18  18 20   Height:      Weight:      SpO2: 94% 94% 93% 91%   Weight change:   Intake/Output Summary (Last 24 hours) at 01/10/12 0859 Last data filed at 01/10/12 0523  Gross per 24 hour  Intake    370 ml  Output    825 ml  Net   -455 ml   Physical Exam: BP 91/58  Pulse 60  Temp(Src) 98.2 F (36.8 C) (Axillary)  Resp 20  Ht 5\' 10"  (1.778 m)  Wt 185 lb 6.5 oz (84.1 kg)  BMI 26.60 kg/m2  SpO2 91%  General Appearance:    Alert, cooperative, no distress, appears stated age  Head:    Normocephalic, without obvious abnormality, atraumatic  Eyes:    PERRL, conjunctiva/corneas clear, EOM's intact, fundi    benign, both eyes       Ears:    Normal TM's and external ear canals, both ears  Nose:   Nares normal, septum midline, mucosa normal, no drainage    or sinus tenderness  Throat:   Lips, mucosa, and tongue normal; teeth and gums normal  Neck:   Supple, symmetrical, trachea midline, no adenopathy;       thyroid:  No enlargement/tenderness/nodules; no carotid   bruit or JVD  Back:     Symmetric, no curvature, ROM normal, no CVA tenderness  Lungs:     Clear to auscultation bilaterally, respirations unlabored, mild  bilateral basilar crackles present better than yesterday.   Chest wall:    No tenderness or deformity  Heart:    Regular rate and rhythm, S1 and S2 normal, no murmur, rub   or gallop  Abdomen:     Soft, non-tender, bowel sounds active all four quadrants,    no masses, no organomegaly  Genitalia:    Normal male without lesion, discharge or tenderness  Rectal:    Normal tone, normal prostate, no  masses or tenderness;   guaiac negative stool  Extremities:   Extremities normal, atraumatic, no cyanosis or edema  Pulses:   2+ and symmetric all extremities  Skin:   Skin color, texture, turgor normal, no rashes or lesions  Lymph nodes:   Cervical, supraclavicular, and axillary nodes normal  Neurologic:   CNII-XII intact. Normal strength, sensation and reflexes      throughout   Lab Results: Basic Metabolic Panel:  Lab 01/10/12 6578 01/07/12 0630 01/06/12 2150  NA 139 143 --  K 4.0 4.4 --  CL 104 109 --  CO2 26 28 --  GLUCOSE 91 91 --  BUN 10 5* --  CREATININE 0.74 0.69 --  CALCIUM 9.2 9.0 --  MG -- -- 2.1  PHOS -- -- --   Liver Function Tests:  Lab 01/06/12 0828  AST 49*  ALT 41  ALKPHOS 166*  BILITOT 0.7  PROT 7.3  ALBUMIN 3.0*   No results found for this basename: LIPASE:2,AMYLASE:2 in the last 168 hours No results found for this basename: AMMONIA:2 in the last 168 hours CBC:  Lab 01/10/12 0535 01/07/12 0630 01/06/12 0828  WBC 7.9 9.6 --  NEUTROABS -- -- 7.3  HGB 11.5* 11.0* --  HCT 35.0* 33.9* --  MCV 90.9 90.4 --  PLT 301 226 --   Cardiac Enzymes: No results found for this basename: CKTOTAL:3,CKMB:3,CKMBINDEX:3,TROPONINI:3 in the last 168 hours BNP: No results found for this basename: PROBNP:3 in the last 168 hours D-Dimer: No results found for this basename: DDIMER:2 in the last 168 hours CBG: No results found for this basename: GLUCAP:6 in the last 168 hours Hemoglobin A1C: No results found for this basename: HGBA1C in the last 168 hours Fasting Lipid Panel: No results found for this basename: CHOL,HDL,LDLCALC,TRIG,CHOLHDL,LDLDIRECT in the last 191 hours Thyroid Function Tests: No results found for this basename: TSH,T4TOTAL,FREET4,T3FREE,THYROIDAB in the last 168 hours Coagulation: No results found for this basename: LABPROT:4,INR:4 in the last 168 hours Anemia Panel: No results found for this basename:  VITAMINB12,FOLATE,FERRITIN,TIBC,IRON,RETICCTPCT in the last 168 hours Urine Drug Screen: Drugs of Abuse  No results found for this basename: labopia,  cocainscrnur,  labbenz,  amphetmu,  thcu,  labbarb    Alcohol Level: No results found for this basename: ETH:2 in the last 168 hours Urinalysis: No results found for this basename: COLORURINE:2,APPERANCEUR:2,LABSPEC:2,PHURINE:2,GLUCOSEU:2,HGBUR:2,BILIRUBINUR:2,KETONESUR:2,PROTEINUR:2,UROBILINOGEN:2,NITRITE:2,LEUKOCYTESUR:2 in the last 168 hours   Micro Results: Recent Results (from the past 240 hour(s))  CULTURE, SPUTUM-ASSESSMENT     Status: Normal   Collection Time   01/07/12  1:00 AM      Component Value Range Status Comment   Specimen Description SPUTUM   Final    Special Requests NONE   Final    Sputum evaluation     Final    Value: THIS SPECIMEN IS ACCEPTABLE. RESPIRATORY CULTURE REPORT TO FOLLOW.   Report Status 01/07/2012 FINAL   Final   CULTURE, RESPIRATORY     Status: Normal   Collection Time   01/07/12  1:00 AM      Component Value Range Status Comment   Specimen Description SPUTUM   Final    Special Requests NONE   Final    Gram Stain     Final    Value: NO WBC SEEN     RARE SQUAMOUS EPITHELIAL CELLS PRESENT     NO ORGANISMS SEEN   Culture NORMAL OROPHARYNGEAL FLORA   Final    Report Status 01/09/2012 FINAL   Final    Studies/Results: No results found. Medications: I have reviewed the patient's current medications. Scheduled Meds:    . albuterol  2.5 mg Nebulization QID  . enoxaparin  40 mg Subcutaneous Q24H  . levofloxacin  750 mg Oral Daily  . nicotine  21 mg Transdermal Q24H  . pantoprazole  40 mg Oral Q1200   Continuous Infusions:   PRN Meds:.acetaminophen, acetaminophen, albuterol, guaiFENesin-dextromethorphan, ondansetron (ZOFRAN) IV, ondansetron Assessment/Plan:   1. Community acquired pneumonia: Atypical vs viral. He continues to be afebrile but is requiring O2 by nasal canula( satting in 90% on 3  litres). He continues to have bouts of cough. Leucocytosis has resolved. His urine for legionella and streptococcal antigen was negative. Sputum gram stain shows normal  oropharyngeal flora. His atypical/viral illness is gradually improving but in the setting of him requiring continuous oxygen, would get a repeat CXR. We would also try to titrate down his O2 by nasal canula today and see how he tolerates. Plan:  -Obtain a repeat CXR. -Continue  on Levaquin. -Continue robitussin and PRN albuterol for symptomatic relief.   2. Tobacco abuse: Counseled him on  smoking cessation.   Continue nicotine patch.  3. DVT: Lovenox.  4. Dispo: Pending his CXR and further clinical course. If he continue to do well throughout the day, we might be able to d/c him In AM (  He lives in a shelter, so evening d/c could be difficult).   LOS: 4 days   Ronnell Makarewicz 01/10/2012, 8:59 AM

## 2012-01-11 MED ORDER — FLUTICASONE-SALMETEROL 100-50 MCG/DOSE IN AEPB
1.0000 | INHALATION_SPRAY | Freq: Two times a day (BID) | RESPIRATORY_TRACT | Status: DC
  Administered 2012-01-11 – 2012-01-13 (×6): 1 via RESPIRATORY_TRACT
  Filled 2012-01-11: qty 14

## 2012-01-11 MED ORDER — ALBUTEROL SULFATE HFA 108 (90 BASE) MCG/ACT IN AERS
2.0000 | INHALATION_SPRAY | RESPIRATORY_TRACT | Status: DC | PRN
  Filled 2012-01-11: qty 6.7

## 2012-01-11 NOTE — Progress Notes (Signed)
Subjective: He still reports having difficulty breathing and continues to have cough. He states that sometimes his chest hurts when he coughs a lot.  Objective: Vital signs in last 24 hours: Filed Vitals:   01/10/12 1530 01/10/12 2135 01/11/12 0518 01/11/12 0545  BP: 116/62 114/63 89/53 95/50   Pulse: 88 70 54 58  Temp: 97 F (36.1 C) 96.9 F (36.1 C) 97.8 F (36.6 C)   TempSrc: Oral Oral Oral   Resp: 20 20 20    Height:      Weight:      SpO2: 95% 94% 92%    Weight change:   Intake/Output Summary (Last 24 hours) at 01/11/12 1124 Last data filed at 01/11/12 0600  Gross per 24 hour  Intake    960 ml  Output    600 ml  Net    360 ml   Physical Exam: BP 95/50  Pulse 58  Temp(Src) 97.8 F (36.6 C) (Oral)  Resp 20  Ht 5\' 10"  (1.778 m)  Wt 185 lb 6.5 oz (84.1 kg)  BMI 26.60 kg/m2  SpO2 92%  General Appearance:    Alert, cooperative, no distress, appears stated age  Head:    Normocephalic, without obvious abnormality, atraumatic  Eyes:    PERRL, conjunctiva/corneas clear, EOM's intact, fundi    benign, both eyes       Ears:    Normal TM's and external ear canals, both ears  Nose:   Nares normal, septum midline, mucosa normal, no drainage    or sinus tenderness  Throat:   Lips, mucosa, and tongue normal; teeth and gums normal  Neck:   Supple, symmetrical, trachea midline, no adenopathy;       thyroid:  No enlargement/tenderness/nodules; no carotid   bruit or JVD  Back:     Symmetric, no curvature, ROM normal, no CVA tenderness  Lungs:     Clear to auscultation bilaterally, respirations unlabored, mild  bilateral basilar crackles present better than yesterday.   Chest wall:    No tenderness or deformity  Heart:    Regular rate and rhythm, S1 and S2 normal, no murmur, rub   or gallop  Abdomen:     Soft, non-tender, bowel sounds active all four quadrants,    no masses, no organomegaly  Genitalia:    Normal male without lesion, discharge or tenderness  Rectal:    Normal  tone, normal prostate, no masses or tenderness;   guaiac negative stool  Extremities:   Extremities normal, atraumatic, no cyanosis or edema  Pulses:   2+ and symmetric all extremities  Skin:   Skin color, texture, turgor normal, no rashes or lesions  Lymph nodes:   Cervical, supraclavicular, and axillary nodes normal  Neurologic:   CNII-XII intact. Normal strength, sensation and reflexes      throughout   Lab Results: Basic Metabolic Panel:  Lab 01/10/12 1610 01/07/12 0630 01/06/12 2150  NA 139 143 --  K 4.0 4.4 --  CL 104 109 --  CO2 26 28 --  GLUCOSE 91 91 --  BUN 10 5* --  CREATININE 0.74 0.69 --  CALCIUM 9.2 9.0 --  MG -- -- 2.1  PHOS -- -- --   Liver Function Tests:  Lab 01/06/12 0828  AST 49*  ALT 41  ALKPHOS 166*  BILITOT 0.7  PROT 7.3  ALBUMIN 3.0*   No results found for this basename: LIPASE:2,AMYLASE:2 in the last 168 hours No results found for this basename: AMMONIA:2 in the last 168 hours  CBC:  Lab 01/10/12 0535 01/07/12 0630 01/06/12 0828  WBC 7.9 9.6 --  NEUTROABS -- -- 7.3  HGB 11.5* 11.0* --  HCT 35.0* 33.9* --  MCV 90.9 90.4 --  PLT 301 226 --   Cardiac Enzymes: No results found for this basename: CKTOTAL:3,CKMB:3,CKMBINDEX:3,TROPONINI:3 in the last 168 hours BNP: No results found for this basename: PROBNP:3 in the last 168 hours D-Dimer: No results found for this basename: DDIMER:2 in the last 168 hours CBG: No results found for this basename: GLUCAP:6 in the last 168 hours Hemoglobin A1C: No results found for this basename: HGBA1C in the last 168 hours Fasting Lipid Panel: No results found for this basename: CHOL,HDL,LDLCALC,TRIG,CHOLHDL,LDLDIRECT in the last 213 hours Thyroid Function Tests: No results found for this basename: TSH,T4TOTAL,FREET4,T3FREE,THYROIDAB in the last 168 hours Coagulation: No results found for this basename: LABPROT:4,INR:4 in the last 168 hours Anemia Panel: No results found for this basename:  VITAMINB12,FOLATE,FERRITIN,TIBC,IRON,RETICCTPCT in the last 168 hours Urine Drug Screen: Drugs of Abuse  No results found for this basename: labopia,  cocainscrnur,  labbenz,  amphetmu,  thcu,  labbarb    Alcohol Level: No results found for this basename: ETH:2 in the last 168 hours Urinalysis: No results found for this basename: COLORURINE:2,APPERANCEUR:2,LABSPEC:2,PHURINE:2,GLUCOSEU:2,HGBUR:2,BILIRUBINUR:2,KETONESUR:2,PROTEINUR:2,UROBILINOGEN:2,NITRITE:2,LEUKOCYTESUR:2 in the last 168 hours   Micro Results: Recent Results (from the past 240 hour(s))  CULTURE, SPUTUM-ASSESSMENT     Status: Normal   Collection Time   01/07/12  1:00 AM      Component Value Range Status Comment   Specimen Description SPUTUM   Final    Special Requests NONE   Final    Sputum evaluation     Final    Value: THIS SPECIMEN IS ACCEPTABLE. RESPIRATORY CULTURE REPORT TO FOLLOW.   Report Status 01/07/2012 FINAL   Final   CULTURE, RESPIRATORY     Status: Normal   Collection Time   01/07/12  1:00 AM      Component Value Range Status Comment   Specimen Description SPUTUM   Final    Special Requests NONE   Final    Gram Stain     Final    Value: NO WBC SEEN     RARE SQUAMOUS EPITHELIAL CELLS PRESENT     NO ORGANISMS SEEN   Culture NORMAL OROPHARYNGEAL FLORA   Final    Report Status 01/09/2012 FINAL   Final    Studies/Results: Dg Chest 2 View  01/10/2012  *RADIOLOGY REPORT*  Clinical Data: Cough and shortness of breath.  Follow up of pneumonia.Every day smoker.  CHEST - 2 VIEW  Comparison: 01/06/2012  Findings: Midline trachea.  Normal heart size and mediastinal contours. No pleural effusion or pneumothorax.  Improvement in lower lobe predominant reticular nodular opacity/interstitial coarsening.  Patchy areas of atelectasis in the infrahilar regions are increased.  No lobar consolidation.  IMPRESSION:  1.  Improved interstitial thickening/reticular nodular opacities. Favor improving viral or atypical bacterial  infection, superimposed upon chronic bronchitis/COPD. 2.  Patchy areas of atelectasis are new since the prior.  Original Report Authenticated By: Consuello Bossier, M.D.   Medications: I have reviewed the patient's current medications. Scheduled Meds:    . albuterol  2.5 mg Nebulization QID  . enoxaparin  40 mg Subcutaneous Q24H  . Fluticasone-Salmeterol  1 puff Inhalation BID  . levofloxacin  750 mg Oral Daily  . nicotine  21 mg Transdermal Q24H  . pantoprazole  40 mg Oral Q1200   Continuous Infusions:   PRN Meds:.acetaminophen, acetaminophen, albuterol, albuterol,  guaiFENesin-dextromethorphan, ondansetron (ZOFRAN) IV, ondansetron Assessment/Plan:   1. Community acquired pneumonia: Atypical vs viral. He continues to be afebrile but is requiring O2 by nasal canula( satting in 90% on 2.5 litres). He states that he has difficulty catching his breath even when he tried to walk around in his room and  continues to have bouts of cough. Leucocytosis has resolved. His urine for legionella and streptococcal antigen was negative. Sputum gram stain shows normal  oropharyngeal flora. His atypical/viral illness is gradually improving but he continues to require oxygen. His repeat CXR form yesterday ( 3/24) shows significant improvement from his admission CXR. We think that his pneumonia secondary to viral/atypical causes that is improving gradually. Plan:  -Continue levaquin -Continue robitussin and PRN albuterol for symptomatic relief. -Start him on advair and albuterol inhalers.   2. Tobacco abuse: Counseled him on smoking cessation.   Continue nicotine patch.  3. DVT: Lovenox.  4. Dispo: pending until further clinical improvement.   LOS: 5 days   Amarissa Koerner 01/11/2012, 11:24 AM

## 2012-01-11 NOTE — Progress Notes (Signed)
Can assist with meds, will need two of each prescription in order to assist. Please call for questions. Johny Shock RN MPH Case Manager [phone]

## 2012-01-11 NOTE — Progress Notes (Signed)
Internal Medicine Teaching Service Attending Note Date: 01/11/2012  Patient name: Nicholas Hanson  Medical record number: [id number]  Date of birth: 1971-09-30    This patient has been seen and discussed with the house staff. Please see their note for complete details. I concur with their findings with the following additions/corrections:  Pt's D/C was delayed bc he remains hypoxic. Cough present but really improved in my opinion. CXR improved. Fever resolved. No leukocytosis. Pt is concerned bc he has never required this long to bounce back after a PNA. I explained that he is now 20 years older since his last PNA and FEV1 decreases with aging. In addition, he now has 20 yrs of tobacco use to prolong his recovery. I "curbsided" ID about the expected time course of hypoxia with atypical PNA and stated this was a bit unusual but it is just supportive therapy until full recovery. Cont with levaquin, nebs, Advair and follow O2 requirements.  Dionicio Shelnutt 01/11/2012, 3:15 PM

## 2012-01-11 NOTE — Progress Notes (Signed)
Provided education to pt and wife about the use of an incentive spirometer and the goal to keep it at "best." Told pt incentive spirometer is supposed to make you cough and open the lungs.

## 2012-01-12 ENCOUNTER — Inpatient Hospital Stay (HOSPITAL_COMMUNITY): Payer: Self-pay

## 2012-01-12 DIAGNOSIS — R0902 Hypoxemia: Secondary | ICD-10-CM

## 2012-01-12 DIAGNOSIS — J189 Pneumonia, unspecified organism: Secondary | ICD-10-CM

## 2012-01-12 DIAGNOSIS — J810 Acute pulmonary edema: Secondary | ICD-10-CM

## 2012-01-12 LAB — RAPID URINE DRUG SCREEN, HOSP PERFORMED
Amphetamines: NOT DETECTED
Benzodiazepines: NOT DETECTED

## 2012-01-12 MED ORDER — IOHEXOL 300 MG/ML  SOLN: 80.0000 mL | Freq: Once | INTRAMUSCULAR | Status: AC | PRN
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
## 2012-01-12 NOTE — Progress Notes (Signed)
1540 Pt left floor with wife without permission or telling staff.  Seen walking in hall but just said "we're walking".  Mae CNA that left for the day to go home called floor and said she saw pt standing at bus stop with wife.  Pt belongings still in room.  Security called to find pt.  Pt finally returned to room at 1630.  Instructed pt that he must tell staff when he is leaving floor and also that he needs to have a dr's order to do it. Pt states "the dr told me I needed to walk so that is what I was doing".  Reminded pt he still needs to let staff know where he is at all times. Ebony Hail RN 01/12/12

## 2012-01-12 NOTE — Progress Notes (Addendum)
Subjective: Patient was sleeping this morning and was not talking much to me." I feel tired". He states that he took advair twice yesterday that helped him a little.  Objective: Vital signs in last 24 hours: Filed Vitals:   01/11/12 1430 01/11/12 2124 01/11/12 2201 01/12/12 0559  BP: 109/60  121/67 105/65  Pulse: 56  67 57  Temp: 96.6 F (35.9 C)  96.8 F (36 C) 97.4 F (36.3 C)  TempSrc: Oral  Oral Axillary  Resp: 20  20 20   Height:      Weight:      SpO2: 93% 94% 94% 93%   Weight change:   Intake/Output Summary (Last 24 hours) at 01/12/12 0831 Last data filed at 01/11/12 2032  Gross per 24 hour  Intake    940 ml  Output    500 ml  Net    440 ml   Physical Exam: BP 105/65  Pulse 57  Temp(Src) 97.4 F (36.3 C) (Axillary)  Resp 20  Ht 5\' 10"  (1.778 m)  Wt 185 lb 6.5 oz (84.1 kg)  BMI 26.60 kg/m2  SpO2 93%  General Appearance:    Alert, cooperative, no distress, appears stated age  Head:    Normocephalic, without obvious abnormality, atraumatic  Eyes:    PERRL, conjunctiva/corneas clear, EOM's intact, fundi    benign, both eyes       Ears:    Normal TM's and external ear canals, both ears  Nose:   Nares normal, septum midline, mucosa normal, no drainage    or sinus tenderness  Throat:   Lips, mucosa, and tongue normal; teeth and gums normal  Neck:   Supple, symmetrical, trachea midline, no adenopathy;       thyroid:  No enlargement/tenderness/nodules; no carotid   bruit or JVD  Back:     Symmetric, no curvature, ROM normal, no CVA tenderness  Lungs:     Clear to auscultation bilaterally, respirations unlabored, mild  bilateral basilar crackles present but improved since admission   Chest wall:    No tenderness or deformity  Heart:    Regular rate and rhythm, S1 and S2 normal, no murmur, rub   or gallop  Abdomen:     Soft, non-tender, bowel sounds active all four quadrants,    no masses, no organomegaly  Genitalia:    Normal male without lesion, discharge or  tenderness  Rectal:    Normal tone, normal prostate, no masses or tenderness;   guaiac negative stool  Extremities:   Extremities normal, atraumatic, no cyanosis or edema  Pulses:   2+ and symmetric all extremities  Skin:   Skin color, texture, turgor normal, no rashes or lesions  Lymph nodes:   Cervical, supraclavicular, and axillary nodes normal  Neurologic:   CNII-XII intact. Normal strength, sensation and reflexes      throughout   Lab Results: Basic Metabolic Panel:  Lab 01/10/12 1610 01/07/12 0630 01/06/12 2150  NA 139 143 --  K 4.0 4.4 --  CL 104 109 --  CO2 26 28 --  GLUCOSE 91 91 --  BUN 10 5* --  CREATININE 0.74 0.69 --  CALCIUM 9.2 9.0 --  MG -- -- 2.1  PHOS -- -- --   Liver Function Tests:  Lab 01/06/12 0828  AST 49*  ALT 41  ALKPHOS 166*  BILITOT 0.7  PROT 7.3  ALBUMIN 3.0*   No results found for this basename: LIPASE:2,AMYLASE:2 in the last 168 hours No results found for this basename:  AMMONIA:2 in the last 168 hours CBC:  Lab 01/10/12 0535 01/07/12 0630 01/06/12 0828  WBC 7.9 9.6 --  NEUTROABS -- -- 7.3  HGB 11.5* 11.0* --  HCT 35.0* 33.9* --  MCV 90.9 90.4 --  PLT 301 226 --   Cardiac Enzymes: No results found for this basename: CKTOTAL:3,CKMB:3,CKMBINDEX:3,TROPONINI:3 in the last 168 hours BNP: No results found for this basename: PROBNP:3 in the last 168 hours D-Dimer: No results found for this basename: DDIMER:2 in the last 168 hours CBG: No results found for this basename: GLUCAP:6 in the last 168 hours Hemoglobin A1C: No results found for this basename: HGBA1C in the last 168 hours Fasting Lipid Panel: No results found for this basename: CHOL,HDL,LDLCALC,TRIG,CHOLHDL,LDLDIRECT in the last 161 hours Thyroid Function Tests: No results found for this basename: TSH,T4TOTAL,FREET4,T3FREE,THYROIDAB in the last 168 hours Coagulation: No results found for this basename: LABPROT:4,INR:4 in the last 168 hours Anemia Panel: No results found for  this basename: VITAMINB12,FOLATE,FERRITIN,TIBC,IRON,RETICCTPCT in the last 168 hours Urine Drug Screen: Drugs of Abuse  No results found for this basename: labopia,  cocainscrnur,  labbenz,  amphetmu,  thcu,  labbarb    Alcohol Level: No results found for this basename: ETH:2 in the last 168 hours Urinalysis: No results found for this basename: COLORURINE:2,APPERANCEUR:2,LABSPEC:2,PHURINE:2,GLUCOSEU:2,HGBUR:2,BILIRUBINUR:2,KETONESUR:2,PROTEINUR:2,UROBILINOGEN:2,NITRITE:2,LEUKOCYTESUR:2 in the last 168 hours   Micro Results: Recent Results (from the past 240 hour(s))  CULTURE, SPUTUM-ASSESSMENT     Status: Normal   Collection Time   01/07/12  1:00 AM      Component Value Range Status Comment   Specimen Description SPUTUM   Final    Special Requests NONE   Final    Sputum evaluation     Final    Value: THIS SPECIMEN IS ACCEPTABLE. RESPIRATORY CULTURE REPORT TO FOLLOW.   Report Status 01/07/2012 FINAL   Final   CULTURE, RESPIRATORY     Status: Normal   Collection Time   01/07/12  1:00 AM      Component Value Range Status Comment   Specimen Description SPUTUM   Final    Special Requests NONE   Final    Gram Stain     Final    Value: NO WBC SEEN     RARE SQUAMOUS EPITHELIAL CELLS PRESENT     NO ORGANISMS SEEN   Culture NORMAL OROPHARYNGEAL FLORA   Final    Report Status 01/09/2012 FINAL   Final    Studies/Results: Dg Chest 2 View  01/10/2012  *RADIOLOGY REPORT*  Clinical Data: Cough and shortness of breath.  Follow up of pneumonia.Every day smoker.  CHEST - 2 VIEW  Comparison: 01/06/2012  Findings: Midline trachea.  Normal heart size and mediastinal contours. No pleural effusion or pneumothorax.  Improvement in lower lobe predominant reticular nodular opacity/interstitial coarsening.  Patchy areas of atelectasis in the infrahilar regions are increased.  No lobar consolidation.  IMPRESSION:  1.  Improved interstitial thickening/reticular nodular opacities. Favor improving viral or  atypical bacterial infection, superimposed upon chronic bronchitis/COPD. 2.  Patchy areas of atelectasis are new since the prior.  Original Report Authenticated By: Consuello Bossier, M.D.   Medications: I have reviewed the patient's current medications. Scheduled Meds:    . albuterol  2.5 mg Nebulization QID  . enoxaparin  40 mg Subcutaneous Q24H  . Fluticasone-Salmeterol  1 puff Inhalation BID  . levofloxacin  750 mg Oral Daily  . nicotine  21 mg Transdermal Q24H  . pantoprazole  40 mg Oral Q1200   Continuous Infusions:  PRN Meds:.acetaminophen, acetaminophen, albuterol, albuterol, guaiFENesin-dextromethorphan, ondansetron (ZOFRAN) IV, ondansetron Assessment/Plan:   1. Community acquired pneumonia: Atypical vs viral. He continues to be afebrile but is requiring O2 by nasal canula( satting in 90% on 3 litres). H e reports feeling tired and has been still coughing. Leucocytosis has resolved. His urine for legionella and streptococcal antigen was negative. Sputum gram stain shows normal  oropharyngeal flora.  His repeat CXR form yesterday ( 3/24) shows significant improvement from his admission CXR. His atypical/viral illness is gradually improving but he continues to require oxygen. We have been encouraging him to use incentive spirometer and walk around. Because of our inability to wean him off his oxygen requirements that has been holding his discharge to the shelter, will seek pulmology inputs! Plan:  -Continue levaquin -Continue robitussin and PRN albuterol for symptomatic relief. -Continue him on advair and albuterol inhalers.   2. Tobacco abuse: Counseled him on smoking cessation.   Continue nicotine patch.  3. DVT: Lovenox.  4. Dispo: pending until further clinical improvement.   LOS: 6 days   Tarius Stangelo 01/12/2012, 8:31 AM

## 2012-01-13 ENCOUNTER — Inpatient Hospital Stay (HOSPITAL_COMMUNITY): Payer: Self-pay

## 2012-01-13 DIAGNOSIS — I270 Primary pulmonary hypertension: Secondary | ICD-10-CM

## 2012-01-13 LAB — RHEUMATOID FACTOR: Rhuematoid fact SerPl-aCnc: <10 IU/mL (ref <=14)

## 2012-01-13 MED ORDER — ALBUTEROL SULFATE (5 MG/ML) 0.5% IN NEBU
2.5000 mg | INHALATION_SOLUTION | Freq: Two times a day (BID) | RESPIRATORY_TRACT | Status: DC
  Administered 2012-01-13 (×2): 2.5 mg via RESPIRATORY_TRACT
  Filled 2012-01-13 (×2): qty 0.5

## 2012-01-13 NOTE — Progress Notes (Signed)
*  PRELIMINARY RESULTS* Echocardiogram 2D Echocardiogram has been performed.  Nicholas Hanson [facility] 01/13/2012, 12:18 PM

## 2012-01-13 NOTE — Progress Notes (Signed)
Full PFT completed. Preliminary results in Progress Notes in Shadow Chart.

## 2012-01-13 NOTE — Progress Notes (Signed)
Subjective: He just came back from PFT's. He still reports some cough and SOB. He was not wearing any oxygen and his O2 sats were 94% on RA. When  I asked him if he felt better, he said he was wearing oxygen all night!   Objective: Vital signs in last 24 hours: Filed Vitals:   01/12/12 1435 01/12/12 2014 01/12/12 2133 01/13/12 0534  BP: 124/90  113/75 98/51  Pulse: 64  76 58  Temp: 96.8 F (36 C)  96.7 F (35.9 C) 97.3 F (36.3 C)  TempSrc: Oral  Oral Oral  Resp: 20  20 18   Height:      Weight:      SpO2: 97% 98% 93% 95%   Weight change:   Intake/Output Summary (Last 24 hours) at 01/13/12 1156 Last data filed at 01/13/12 0900  Gross per 24 hour  Intake    360 ml  Output    600 ml  Net   -240 ml   Physical Exam: BP 98/51  Pulse 58  Temp(Src) 97.3 F (36.3 C) (Oral)  Resp 18  Ht 5\' 10"  (1.778 m)  Wt 185 lb 6.5 oz (84.1 kg)  BMI 26.60 kg/m2  SpO2 95%  General Appearance:    Alert, cooperative, no distress, appears stated age  Head:    Normocephalic, without obvious abnormality, atraumatic  Eyes:    PERRL, conjunctiva/corneas clear, EOM's intact, fundi    benign, both eyes       Ears:    Normal TM's and external ear canals, both ears  Nose:   Nares normal, septum midline, mucosa normal, no drainage    or sinus tenderness  Throat:   Lips, mucosa, and tongue normal; teeth and gums normal  Neck:   Supple, symmetrical, trachea midline, no adenopathy;       thyroid:  No enlargement/tenderness/nodules; no carotid   bruit or JVD  Back:     Symmetric, no curvature, ROM normal, no CVA tenderness  Lungs:     Clear to auscultation bilaterally, respirations unlabored, mild  bilateral basilar crackles     Chest wall:    No tenderness or deformity  Heart:    Regular rate and rhythm, S1 and S2 normal, no murmur, rub   or gallop  Abdomen:     Soft, non-tender, bowel sounds active all four quadrants,    no masses, no organomegaly  Genitalia:    Normal male without lesion,  discharge or tenderness  Rectal:    Normal tone, normal prostate, no masses or tenderness;   guaiac negative stool  Extremities:   Extremities normal, atraumatic, no cyanosis or edema  Pulses:   2+ and symmetric all extremities  Skin:   Skin color, texture, turgor normal, no rashes or lesions  Lymph nodes:   Cervical, supraclavicular, and axillary nodes normal  Neurologic:   CNII-XII intact. Normal strength, sensation and reflexes      throughout   Lab Results: Basic Metabolic Panel:  Lab 01/10/12 1191 01/07/12 0630 01/06/12 2150  NA 139 143 --  K 4.0 4.4 --  CL 104 109 --  CO2 26 28 --  GLUCOSE 91 91 --  BUN 10 5* --  CREATININE 0.74 0.69 --  CALCIUM 9.2 9.0 --  MG -- -- 2.1  PHOS -- -- --   Liver Function Tests: No results found for this basename: AST:2,ALT:2,ALKPHOS:2,BILITOT:2,PROT:2,ALBUMIN:2 in the last 168 hours No results found for this basename: LIPASE:2,AMYLASE:2 in the last 168 hours No results found for this  basename: AMMONIA:2 in the last 168 hours CBC:  Lab 01/10/12 0535 01/07/12 0630  WBC 7.9 9.6  NEUTROABS -- --  HGB 11.5* 11.0*  HCT 35.0* 33.9*  MCV 90.9 90.4  PLT 301 226   Cardiac Enzymes: No results found for this basename: CKTOTAL:3,CKMB:3,CKMBINDEX:3,TROPONINI:3 in the last 168 hours BNP: No results found for this basename: PROBNP:3 in the last 168 hours D-Dimer: No results found for this basename: DDIMER:2 in the last 168 hours CBG: No results found for this basename: GLUCAP:6 in the last 168 hours Hemoglobin A1C: No results found for this basename: HGBA1C in the last 168 hours Fasting Lipid Panel: No results found for this basename: CHOL,HDL,LDLCALC,TRIG,CHOLHDL,LDLDIRECT in the last 161 hours Thyroid Function Tests: No results found for this basename: TSH,T4TOTAL,FREET4,T3FREE,THYROIDAB in the last 168 hours Coagulation: No results found for this basename: LABPROT:4,INR:4 in the last 168 hours Anemia Panel: No results found for this  basename: VITAMINB12,FOLATE,FERRITIN,TIBC,IRON,RETICCTPCT in the last 168 hours Urine Drug Screen: Drugs of Abuse     Component Value Date/Time   LABOPIA NONE DETECTED 01/12/2012 2002    Alcohol Level: No results found for this basename: ETH:2 in the last 168 hours Urinalysis: No results found for this basename: COLORURINE:2,APPERANCEUR:2,LABSPEC:2,PHURINE:2,GLUCOSEU:2,HGBUR:2,BILIRUBINUR:2,KETONESUR:2,PROTEINUR:2,UROBILINOGEN:2,NITRITE:2,LEUKOCYTESUR:2 in the last 168 hours   Micro Results: Recent Results (from the past 240 hour(s))  CULTURE, SPUTUM-ASSESSMENT     Status: Normal   Collection Time   01/07/12  1:00 AM      Component Value Range Status Comment   Specimen Description SPUTUM   Final    Special Requests NONE   Final    Sputum evaluation     Final    Value: THIS SPECIMEN IS ACCEPTABLE. RESPIRATORY CULTURE REPORT TO FOLLOW.   Report Status 01/07/2012 FINAL   Final   CULTURE, RESPIRATORY     Status: Normal   Collection Time   01/07/12  1:00 AM      Component Value Range Status Comment   Specimen Description SPUTUM   Final    Special Requests NONE   Final    Gram Stain     Final    Value: NO WBC SEEN     RARE SQUAMOUS EPITHELIAL CELLS PRESENT     NO ORGANISMS SEEN   Culture NORMAL OROPHARYNGEAL FLORA   Final    Report Status 01/09/2012 FINAL   Final    Studies/Results: Ct Chest W Contrast  01/12/2012    *RADIOLOGY REPORT*  Clinical Data: Pneumonia.  Evaluate bronchiectasis.  CT CHEST WITH CONTRAST  Technique:  Multidetector CT imaging of the chest was performed following the standard protocol during bolus administration of intravenous contrast.  Contrast:  80 ml Omnipaque-300  Comparison: None.  Findings: The chest wall is unremarkable.  No supraclavicular or axillary adenopathy.  The bony thorax is intact.  No destructive bony lesions or spinal canal compromise.  The heart is normal in size.  No pericardial effusion.  No mediastinal or hilar lymphadenopathy.  The  esophagus is grossly normal.  The aorta is normal in caliber.  No dissection.  Examination of the lung parenchyma including high resolution cuts demonstrates patchy areas of subsegmental atelectasis, patchy ground-glass opacity, peribronchial thickening and peribronchial inflammation.  There also small scattered centrilobular airspace nodules and areas of lower lobe bronchiectasis.  Findings most likely due to a clearing atypical pneumonia with some residual active alveolitis and bronchitis.  Areas of tree in bud appearance and the centrilobular nodules make an atypical mycobacterial infection possible.  No focal air space consolidations/typical  pneumonia.  No pleural effusions or pulmonary edema.  The tracheobronchial tree is grossly normal centrally.  The upper abdomen is unremarkable.  IMPRESSION:  1.  Findings suggest a clearing atypical pneumonia / pneumonitis with residual areas of alveolitis and bronchitis. 2.  Patchy areas of tree in bud appearance and centrilobular nodules may suggest atypical mycobacterial infection. 3.  Patchy areas of atelectasis and lower lobe bronchiectasis.  Original Report Authenticated By: P. Loralie Champagne, M.D.   Medications: I have reviewed the patient's current medications. Scheduled Meds:    . albuterol  2.5 mg Nebulization BID  . enoxaparin  40 mg Subcutaneous Q24H  . Fluticasone-Salmeterol  1 puff Inhalation BID  . levofloxacin  750 mg Oral Daily  . nicotine  21 mg Transdermal Q24H  . pantoprazole  40 mg Oral Q1200  . DISCONTD: albuterol  2.5 mg Nebulization QID   Continuous Infusions:   PRN Meds:.acetaminophen, acetaminophen, albuterol, albuterol, guaiFENesin-dextromethorphan, iohexol, ondansetron (ZOFRAN) IV, ondansetron Assessment/Plan:   1. Community acquired pneumonia: Atypical vs viral. He continues to be afebrile but is requiring O2 by nasal canula( satting in 90% on 3 litres). H e reports feeling tired and has been still coughing. Leucocytosis has  resolved. His urine for legionella and streptococcal antigen was negative. Sputum gram stain shows normal  oropharyngeal flora.  His repeat CXR form yesterday ( 3/24) shows significant improvement from his admission CXR. His atypical/viral illness is gradually improving but he continues to require oxygen. We have been encouraging him to use incentive spirometer and walk around. Because of our inability to wean him off his oxygen requirements that has been holding his discharge to the shelter, will seek pulmology inputs! Plan:  -Continue levaquin -Continue robitussin and PRN albuterol for symptomatic relief. -Continue him on advair and albuterol inhalers.   2. Tobacco abuse: Counseled him on smoking cessation.   Continue nicotine patch.  3. DVT: Lovenox.  4. Dispo: pending until further clinical improvement.   LOS: 7 days   Jesseka Drinkard 01/13/2012, 11:56 AM    Subjective: Patient was sleeping this morning and was not talking much to me." I feel tired". He states that he took advair twice yesterday that helped him a little.  Objective: Vital signs in last 24 hours: Filed Vitals:   01/12/12 1435 01/12/12 2014 01/12/12 2133 01/13/12 0534  BP: 124/90  113/75 98/51  Pulse: 64  76 58  Temp: 96.8 F (36 C)  96.7 F (35.9 C) 97.3 F (36.3 C)  TempSrc: Oral  Oral Oral  Resp: 20  20 18   Height:      Weight:      SpO2: 97% 98% 93% 95%   Weight change:   Intake/Output Summary (Last 24 hours) at 01/13/12 1156 Last data filed at 01/13/12 0900  Gross per 24 hour  Intake    360 ml  Output    600 ml  Net   -240 ml   Physical Exam: BP 98/51  Pulse 58  Temp(Src) 97.3 F (36.3 C) (Oral)  Resp 18  Ht 5\' 10"  (1.778 m)  Wt 185 lb 6.5 oz (84.1 kg)  BMI 26.60 kg/m2  SpO2 95%  General Appearance:    Alert, cooperative, no distress, appears stated age  Head:    Normocephalic, without obvious abnormality, atraumatic  Eyes:    PERRL, conjunctiva/corneas clear, EOM's intact, fundi     benign, both eyes       Ears:    Normal TM's and external ear canals, both ears  Nose:  Nares normal, septum midline, mucosa normal, no drainage    or sinus tenderness  Throat:   Lips, mucosa, and tongue normal; teeth and gums normal  Neck:   Supple, symmetrical, trachea midline, no adenopathy;       thyroid:  No enlargement/tenderness/nodules; no carotid   bruit or JVD  Back:     Symmetric, no curvature, ROM normal, no CVA tenderness  Lungs:     Clear to auscultation bilaterally, respirations unlabored, mild  bilateral basilar crackles present but improved since admission   Chest wall:    No tenderness or deformity  Heart:    Regular rate and rhythm, S1 and S2 normal, no murmur, rub   or gallop  Abdomen:     Soft, non-tender, bowel sounds active all four quadrants,    no masses, no organomegaly  Genitalia:    Normal male without lesion, discharge or tenderness  Rectal:    Normal tone, normal prostate, no masses or tenderness;   guaiac negative stool  Extremities:   Extremities normal, atraumatic, no cyanosis or edema  Pulses:   2+ and symmetric all extremities  Skin:   Skin color, texture, turgor normal, no rashes or lesions  Lymph nodes:   Cervical, supraclavicular, and axillary nodes normal  Neurologic:   CNII-XII intact. Normal strength, sensation and reflexes      throughout   Lab Results: Basic Metabolic Panel:  Lab 01/10/12 0981 01/07/12 0630 01/06/12 2150  NA 139 143 --  K 4.0 4.4 --  CL 104 109 --  CO2 26 28 --  GLUCOSE 91 91 --  BUN 10 5* --  CREATININE 0.74 0.69 --  CALCIUM 9.2 9.0 --  MG -- -- 2.1  PHOS -- -- --   Liver Function Tests: No results found for this basename: AST:2,ALT:2,ALKPHOS:2,BILITOT:2,PROT:2,ALBUMIN:2 in the last 168 hours No results found for this basename: LIPASE:2,AMYLASE:2 in the last 168 hours No results found for this basename: AMMONIA:2 in the last 168 hours CBC:  Lab 01/10/12 0535 01/07/12 0630  WBC 7.9 9.6  NEUTROABS -- --  HGB  11.5* 11.0*  HCT 35.0* 33.9*  MCV 90.9 90.4  PLT 301 226   Cardiac Enzymes: No results found for this basename: CKTOTAL:3,CKMB:3,CKMBINDEX:3,TROPONINI:3 in the last 168 hours BNP: No results found for this basename: PROBNP:3 in the last 168 hours D-Dimer: No results found for this basename: DDIMER:2 in the last 168 hours CBG: No results found for this basename: GLUCAP:6 in the last 168 hours Hemoglobin A1C: No results found for this basename: HGBA1C in the last 168 hours Fasting Lipid Panel: No results found for this basename: CHOL,HDL,LDLCALC,TRIG,CHOLHDL,LDLDIRECT in the last 191 hours Thyroid Function Tests: No results found for this basename: TSH,T4TOTAL,FREET4,T3FREE,THYROIDAB in the last 168 hours Coagulation: No results found for this basename: LABPROT:4,INR:4 in the last 168 hours Anemia Panel: No results found for this basename: VITAMINB12,FOLATE,FERRITIN,TIBC,IRON,RETICCTPCT in the last 168 hours Urine Drug Screen: Drugs of Abuse     Component Value Date/Time   LABOPIA NONE DETECTED 01/12/2012 2002    Alcohol Level: No results found for this basename: ETH:2 in the last 168 hours Urinalysis: No results found for this basename: COLORURINE:2,APPERANCEUR:2,LABSPEC:2,PHURINE:2,GLUCOSEU:2,HGBUR:2,BILIRUBINUR:2,KETONESUR:2,PROTEINUR:2,UROBILINOGEN:2,NITRITE:2,LEUKOCYTESUR:2 in the last 168 hours   Micro Results: Recent Results (from the past 240 hour(s))  CULTURE, SPUTUM-ASSESSMENT     Status: Normal   Collection Time   01/07/12  1:00 AM      Component Value Range Status Comment   Specimen Description SPUTUM   Final    Special Requests  NONE   Final    Sputum evaluation     Final    Value: THIS SPECIMEN IS ACCEPTABLE. RESPIRATORY CULTURE REPORT TO FOLLOW.   Report Status 01/07/2012 FINAL   Final   CULTURE, RESPIRATORY     Status: Normal   Collection Time   01/07/12  1:00 AM      Component Value Range Status Comment   Specimen Description SPUTUM   Final    Special  Requests NONE   Final    Gram Stain     Final    Value: NO WBC SEEN     RARE SQUAMOUS EPITHELIAL CELLS PRESENT     NO ORGANISMS SEEN   Culture NORMAL OROPHARYNGEAL FLORA   Final    Report Status 01/09/2012 FINAL   Final    Studies/Results: Ct Chest W Contrast  01/12/2012    *RADIOLOGY REPORT*  Clinical Data: Pneumonia.  Evaluate bronchiectasis.  CT CHEST WITH CONTRAST  Technique:  Multidetector CT imaging of the chest was performed following the standard protocol during bolus administration of intravenous contrast.  Contrast:  80 ml Omnipaque-300  Comparison: None.  Findings: The chest wall is unremarkable.  No supraclavicular or axillary adenopathy.  The bony thorax is intact.  No destructive bony lesions or spinal canal compromise.  The heart is normal in size.  No pericardial effusion.  No mediastinal or hilar lymphadenopathy.  The esophagus is grossly normal.  The aorta is normal in caliber.  No dissection.  Examination of the lung parenchyma including high resolution cuts demonstrates patchy areas of subsegmental atelectasis, patchy ground-glass opacity, peribronchial thickening and peribronchial inflammation.  There also small scattered centrilobular airspace nodules and areas of lower lobe bronchiectasis.  Findings most likely due to a clearing atypical pneumonia with some residual active alveolitis and bronchitis.  Areas of tree in bud appearance and the centrilobular nodules make an atypical mycobacterial infection possible.  No focal air space consolidations/typical pneumonia.  No pleural effusions or pulmonary edema.  The tracheobronchial tree is grossly normal centrally.  The upper abdomen is unremarkable.  IMPRESSION:  1.  Findings suggest a clearing atypical pneumonia / pneumonitis with residual areas of alveolitis and bronchitis. 2.  Patchy areas of tree in bud appearance and centrilobular nodules may suggest atypical mycobacterial infection. 3.  Patchy areas of atelectasis and lower lobe  bronchiectasis.  Original Report Authenticated By: P. Loralie Champagne, M.D.   Medications: I have reviewed the patient's current medications. Scheduled Meds:    . albuterol  2.5 mg Nebulization BID  . enoxaparin  40 mg Subcutaneous Q24H  . Fluticasone-Salmeterol  1 puff Inhalation BID  . levofloxacin  750 mg Oral Daily  . nicotine  21 mg Transdermal Q24H  . pantoprazole  40 mg Oral Q1200  . DISCONTD: albuterol  2.5 mg Nebulization QID   Continuous Infusions:   PRN Meds:.acetaminophen, acetaminophen, albuterol, albuterol, guaiFENesin-dextromethorphan, iohexol, ondansetron (ZOFRAN) IV, ondansetron Assessment/Plan:   1. Community acquired pneumonia: Atypical vs viral. He continues to be afebrile but is requiring O2 by nasal canula( satting in 90% on 2 litres). H e still reports having cough and SOB .  As per the nursing report , he left the floor yesterday and was found at the bus stop when a nursing tech was leaving for home. When I asked him today, he clearly denies that and states that he has been just walking in the hallways and going to cafetaria. We are worried that he might be still SMOKING!  He just came  back from PFT's and his current O2 sats are 94% on RA. Appreciate Pulmonary ( Dr. Molli Knock) seeing him yesterday. CT scan shows clearing of atypical PNA. His PFT's shows obstructive pattern consistent with COPD but overinflation was inconsistent with diagnosis.His mildly elevated ESR and CRP could be from his acute illness but RA factor was negative. Plan:   -Appreciate pulmonary inputs! -Continue levaquin -Continue robitussin and PRN albuterol for symptomatic relief. -Continue him on advair and albuterol inhalers. - F/U 2D echo that is still pending   2. Tobacco abuse: Counseled him on smoking cessation.   Continue nicotine patch.  3. DVT: Lovenox.  4. Dispo: If he continues to sat well on RA, d/c home tom.   LOS: 7 days   Rini Moffit 01/13/2012, 11:56 AM

## 2012-01-14 NOTE — Discharge Summary (Signed)
Internal Medicine Teaching [facility] Discharge Note  Name: Nicholas Hanson MRN: [id number] DOB: [birth year] 41 y.o.  Date of Admission: 01/06/2012  7:42 AM Date of Discharge: 01/14/2012 Attending Physician: Burns Spain, MD  Discharge Diagnosis: Principal Problem:  Community acquired pneumonia  Tobacco abuse  Images to follow up at clinic follow up: CXR   Discharge Medications: Medication List    Notice       You have not been prescribed any medications.             Disposition and follow-up:   Nicholas Hanson was discharged from [facility] in Stable condition.    Follow-up Appointments: Follow-up Information    Follow up with [facility], MD on 01/28/2012. (at 2: 15 PM)    Contact information:   [address] [place] [address] [phone]         Discharge Orders    Future Appointments: Provider: Department: Dept Phone: Center:   01/28/2012 2:15 PM Zollie Beckers, MD [facility] [phone] [facility]     Future Orders Please Complete By Expires   Increase activity slowly      Call MD for:  temperature >100.4      Call MD for:  persistant nausea and vomiting      Call MD for:  severe uncontrolled pain         Consultations: Pulmonolgy  Procedures Performed:  Dg Chest 2 View  01/10/2012  *RADIOLOGY REPORT*  Clinical Data: Cough and shortness of breath.  Follow up of pneumonia.Every day smoker.  CHEST - 2 VIEW  Comparison: 01/06/2012  Findings: Midline trachea.  Normal heart size and mediastinal contours. No pleural effusion or pneumothorax.  Improvement in lower lobe predominant reticular nodular opacity/interstitial coarsening.  Patchy areas of atelectasis in the infrahilar regions are increased.  No lobar consolidation.  IMPRESSION:  1.  Improved interstitial thickening/reticular nodular opacities. Favor improving viral or atypical bacterial infection, superimposed upon chronic bronchitis/COPD. 2.  Patchy  areas of atelectasis are new since the prior.  Original Report Authenticated By: Consuello Bossier, M.D.   Dg Chest 2 View  01/06/2012  *RADIOLOGY REPORT*  Clinical Data: Cough, congestion, fever  CHEST - 2 VIEW  Comparison: None.  Findings: There are coarsely prominent interstitial markings and some nodularity throughout the mid and lower lung fields.  This may represent pneumonia possibly viral or atypical.  No effusion is seen.  The heart is within normal limits in size.  No bony abnormality is noted.  IMPRESSION: Prominent coarse interstitial markings with some nodularity suspicious for pneumonia possibly atypical or viral.  Original Report Authenticated By: Juline Patch, M.D.   Ct Chest W Contrast  01/12/2012    *RADIOLOGY REPORT*  Clinical Data: Pneumonia.  Evaluate bronchiectasis.  CT CHEST WITH CONTRAST  Technique:  Multidetector CT imaging of the chest was performed following the standard protocol during bolus administration of intravenous contrast.  Contrast:  80 ml Omnipaque-300  Comparison: None.  Findings: The chest wall is unremarkable.  No supraclavicular or axillary adenopathy.  The bony thorax is intact.  No destructive bony lesions or spinal canal compromise.  The heart is normal in size.  No pericardial effusion.  No mediastinal or hilar lymphadenopathy.  The esophagus is grossly normal.  The aorta is normal in caliber.  No dissection.  Examination of the lung parenchyma including high resolution cuts demonstrates patchy areas of subsegmental atelectasis, patchy ground-glass opacity, peribronchial thickening and peribronchial inflammation.  There  also small scattered centrilobular airspace nodules and areas of lower lobe bronchiectasis.  Findings most likely due to a clearing atypical pneumonia with some residual active alveolitis and bronchitis.  Areas of tree in bud appearance and the centrilobular nodules make an atypical mycobacterial infection possible.  No focal air space  consolidations/typical pneumonia.  No pleural effusions or pulmonary edema.  The tracheobronchial tree is grossly normal centrally.  The upper abdomen is unremarkable.  IMPRESSION:  1.  Findings suggest a clearing atypical pneumonia / pneumonitis with residual areas of alveolitis and bronchitis. 2.  Patchy areas of tree in bud appearance and centrilobular nodules may suggest atypical mycobacterial infection. 3.  Patchy areas of atelectasis and lower lobe bronchiectasis.  Original Report Authenticated By: P. Loralie Champagne, M.D.    2D Echo: - Left ventricle: The cavity size was at the upper limits of normal. Wall thickness was normal. The estimated ejection fraction was 55%. Wall motion was normal; there were no regional wall motion abnormalities. Indeterminant diastolic function but LV strain pattern appears normal. - Aortic valve: There was no stenosis. - Mitral valve: No significant regurgitation. - Right ventricle: The cavity size was normal. Systolic function was normal. - Tricuspid valve: Peak RV-RA gradient: 27mm Hg (S). - Pulmonary arteries: PA peak pressure: 32mm Hg (S). - Inferior vena cava: The vessel was normal in size; the respirophasic diameter changes were in the normal range (= 50%); findings are consistent with normal central venous Pressure.  PFT's: non conclusive-  obstructive pattern consistent with emphysema that got corrected with overinflation, which did not confirm the diagnosis.    Admission HPI: Nicholas Hanson is a pleasant 41 year old white male with no significant past medical history who presents with upper respiratory symptoms of cough and cold for one week. He states that he was in his normal state of health until he developed a cold and non-productive cough one week ago. He tried Robitussin and Ibuprofen, but his symptoms continued to worsen and two days ago he starting coughing up "milky yellowish stuff", felt feverish and vomited 3-4 times that was associated  with the cough. He states that he was having chest wall pain with cough, but non-radiating and denies diaphoresis prior to ED arrival. He also states that he is short of breath and has myalgias in his thighs and shoulder and has not had a BM for 2-3 days because of poor intake. Denies GU symptoms, hematemesis, chills, headache, melena, alcohol last used in Christmas. Of note, he lives with his newly wedded wife in [place] and does state that there have been sick people at this shelter. He has no primary care doctor, and has not rec'd an influenza vaccination. He has reduced his smoking during this illness from 1 pk/day to 5-6 cigs/day.  In the ED, he was started on IV ceftriaxone and azithromycin after a chest xray showed patchy infiltrates bilaterally concerning for viral vs. Atypical pneumonia. EKG showed sinus tachycardia. He was satting 97-99% on 4L of Oxygen, but desaturated to 90% on room air and was diaphoretic after coming to the ED per the patient. We admitted him to the floor. His wife Olivia Mackie is with him in the ED.   Hospital Course by problem list:   1. Community acquired pneumonia: Patient presented with symptoms of cough,  SOB for last few days and a fever of 102.3. His CXR on presentation showed prominent coarse interstitial markings. His symptoms and CXR were consistent with viral /atypical pneumonia. He was checked for  urinary streptococcal and legionella antigen  Which were negative. His sputum cultures were consistent with normal oropharyngeal flora. His hospital course was quite prolonged  because of his hypoxia due to which it was very difficult to wean him off oxygen. Because of the continued weaning difficulty off oxygen, pulmonology consult was obtained and they recommended HRCT, PFT's, 2 D echo and some studies for autoimmune work up . Fortunately all the the studies were negative and his CT scan showed improvement in interstitial markings. He was initially treated with  ceftriaxone and Zithromax for 2 days and was also placed on bactrim and steroids with the supsicion for PCP for one day . But with his negative HIV antibodies, bactrim and prednisone were d/c and he was transitioned to oral levaquin. He continued to improve and completed 8 days of antibiotics with his hospital stay. At his clinic follow up , we need to repeat a CXR to follow up on the resolution of pneumonia.   2. Tobacco abuse: He was counseled on cessation and was offered various means but it seemed that he was not ready at this time.   Discharge Vitals:  BP 95/53  Pulse 55  Temp(Src) 97.1 F (36.2 C) (Oral)  Resp 20  Ht 5\' 10"  (1.778 m)  Wt 185 lb 6.5 oz (84.1 kg)  BMI 26.60 kg/m2  SpO2 92%  Discharge Labs: No results found for this or any previous visit (from the past 24 hour(s)).  Signed: Jarmarcus Wambold 01/14/2012, 11:56 AM

## 2012-01-14 NOTE — Progress Notes (Signed)
Noted pt d/c and CM received no info re prescriptions. Call placed to pt RN on 4500 who states that MD informed her that the pt had completed his course of meds and had no further needs. However prescriptions for Levoquin were found in the shadow chart and the RN gave these to the pt at d/c. CM not informed of this until pt was d/c.  Johny Shock RN MPH Case manager [phone]

## 2012-01-14 NOTE — Progress Notes (Addendum)
Patient discharged ambulatory off unit. Discharge instructions given, and patient is without questions. Denies C/O pain or other discomforts. Patient informed this RN that he and his wife are no longer in a shelter, and will be out of on streets. Patient reminded of follow up appointment. Ayesha Rumpf RN

## 2012-01-14 NOTE — Progress Notes (Signed)
MD, please provide duplicate prescriptions for d/c meds as CM will be able to assist with some meds.  Johny Shock RN MPH Case Manager [phone]

## 2012-01-14 NOTE — Progress Notes (Signed)
Subjective:He was sleeping and was not talking much. States that cough and SOB is improving.   Objective: Vital signs in last 24 hours: Filed Vitals:   01/13/12 1510 01/13/12 2129 01/13/12 2206 01/14/12 0500  BP: 106/54  113/69 95/53  Pulse: 103  74 55  Temp: 97 F (36.1 C)  97.2 F (36.2 C) 97.1 F (36.2 C)  TempSrc: Oral  Oral Oral  Resp: 20  18 20   Height:      Weight:      SpO2: 94% 96% 94% 92%   Weight change:   Intake/Output Summary (Last 24 hours) at 01/14/12 0847 Last data filed at 01/14/12 0500  Gross per 24 hour  Intake    360 ml  Output      0 ml  Net    360 ml   Physical Exam: BP 95/53  Pulse 55  Temp(Src) 97.1 F (36.2 C) (Oral)  Resp 20  Ht 5\' 10"  (1.778 m)  Wt 185 lb 6.5 oz (84.1 kg)  BMI 26.60 kg/m2  SpO2 92%  General Appearance:    Alert, cooperative, no distress, appears stated age  Head:    Normocephalic, without obvious abnormality, atraumatic  Eyes:    PERRL, conjunctiva/corneas clear, EOM's intact, fundi    benign, both eyes       Ears:    Normal TM's and external ear canals, both ears  Nose:   Nares normal, septum midline, mucosa normal, no drainage    or sinus tenderness  Throat:   Lips, mucosa, and tongue normal; teeth and gums normal  Neck:   Supple, symmetrical, trachea midline, no adenopathy;       thyroid:  No enlargement/tenderness/nodules; no carotid   bruit or JVD  Back:     Symmetric, no curvature, ROM normal, no CVA tenderness  Lungs:     Clear to auscultation bilaterally, respirations unlabored, good air movement bilaterally  Chest wall:    No tenderness or deformity  Heart:    Regular rate and rhythm, S1 and S2 normal, no murmur, rub   or gallop  Abdomen:     Soft, non-tender, bowel sounds active all four quadrants,    no masses, no organomegaly  Genitalia:    Normal male without lesion, discharge or tenderness  Rectal:    Normal tone, normal prostate, no masses or tenderness;   guaiac negative stool  Extremities:    Extremities normal, atraumatic, no cyanosis or edema  Pulses:   2+ and symmetric all extremities  Skin:   Skin color, texture, turgor normal, no rashes or lesions  Lymph nodes:   Cervical, supraclavicular, and axillary nodes normal  Neurologic:   CNII-XII intact. Normal strength, sensation and reflexes      throughout   Lab Results: Basic Metabolic Panel:  Lab 01/10/12 3329  NA 139  K 4.0  CL 104  CO2 26  GLUCOSE 91  BUN 10  CREATININE 0.74  CALCIUM 9.2  MG --  PHOS --   Liver Function Tests: No results found for this basename: AST:2,ALT:2,ALKPHOS:2,BILITOT:2,PROT:2,ALBUMIN:2 in the last 168 hours No results found for this basename: LIPASE:2,AMYLASE:2 in the last 168 hours No results found for this basename: AMMONIA:2 in the last 168 hours CBC:  Lab 01/10/12 0535  WBC 7.9  NEUTROABS --  HGB 11.5*  HCT 35.0*  MCV 90.9  PLT 301   Cardiac Enzymes: No results found for this basename: CKTOTAL:3,CKMB:3,CKMBINDEX:3,TROPONINI:3 in the last 168 hours BNP: No results found for this basename: PROBNP:3 in  the last 168 hours D-Dimer: No results found for this basename: DDIMER:2 in the last 168 hours CBG: No results found for this basename: GLUCAP:6 in the last 168 hours Hemoglobin A1C: No results found for this basename: HGBA1C in the last 168 hours Fasting Lipid Panel: No results found for this basename: CHOL,HDL,LDLCALC,TRIG,CHOLHDL,LDLDIRECT in the last 409 hours Thyroid Function Tests: No results found for this basename: TSH,T4TOTAL,FREET4,T3FREE,THYROIDAB in the last 168 hours Coagulation: No results found for this basename: LABPROT:4,INR:4 in the last 168 hours Anemia Panel: No results found for this basename: VITAMINB12,FOLATE,FERRITIN,TIBC,IRON,RETICCTPCT in the last 168 hours Urine Drug Screen: Drugs of Abuse     Component Value Date/Time   LABOPIA NONE DETECTED 01/12/2012 2002    Alcohol Level: No results found for this basename: ETH:2 in the last 168  hours Urinalysis: No results found for this basename: COLORURINE:2,APPERANCEUR:2,LABSPEC:2,PHURINE:2,GLUCOSEU:2,HGBUR:2,BILIRUBINUR:2,KETONESUR:2,PROTEINUR:2,UROBILINOGEN:2,NITRITE:2,LEUKOCYTESUR:2 in the last 168 hours   Micro Results: Recent Results (from the past 240 hour(s))  CULTURE, SPUTUM-ASSESSMENT     Status: Normal   Collection Time   01/07/12  1:00 AM      Component Value Range Status Comment   Specimen Description SPUTUM   Final    Special Requests NONE   Final    Sputum evaluation     Final    Value: THIS SPECIMEN IS ACCEPTABLE. RESPIRATORY CULTURE REPORT TO FOLLOW.   Report Status 01/07/2012 FINAL   Final   CULTURE, RESPIRATORY     Status: Normal   Collection Time   01/07/12  1:00 AM      Component Value Range Status Comment   Specimen Description SPUTUM   Final    Special Requests NONE   Final    Gram Stain     Final    Value: NO WBC SEEN     RARE SQUAMOUS EPITHELIAL CELLS PRESENT     NO ORGANISMS SEEN   Culture NORMAL OROPHARYNGEAL FLORA   Final    Report Status 01/09/2012 FINAL   Final    Studies/Results: Ct Chest W Contrast  01/12/2012    *RADIOLOGY REPORT*  Clinical Data: Pneumonia.  Evaluate bronchiectasis.  CT CHEST WITH CONTRAST  Technique:  Multidetector CT imaging of the chest was performed following the standard protocol during bolus administration of intravenous contrast.  Contrast:  80 ml Omnipaque-300  Comparison: None.  Findings: The chest wall is unremarkable.  No supraclavicular or axillary adenopathy.  The bony thorax is intact.  No destructive bony lesions or spinal canal compromise.  The heart is normal in size.  No pericardial effusion.  No mediastinal or hilar lymphadenopathy.  The esophagus is grossly normal.  The aorta is normal in caliber.  No dissection.  Examination of the lung parenchyma including high resolution cuts demonstrates patchy areas of subsegmental atelectasis, patchy ground-glass opacity, peribronchial thickening and peribronchial  inflammation.  There also small scattered centrilobular airspace nodules and areas of lower lobe bronchiectasis.  Findings most likely due to a clearing atypical pneumonia with some residual active alveolitis and bronchitis.  Areas of tree in bud appearance and the centrilobular nodules make an atypical mycobacterial infection possible.  No focal air space consolidations/typical pneumonia.  No pleural effusions or pulmonary edema.  The tracheobronchial tree is grossly normal centrally.  The upper abdomen is unremarkable.  IMPRESSION:  1.  Findings suggest a clearing atypical pneumonia / pneumonitis with residual areas of alveolitis and bronchitis. 2.  Patchy areas of tree in bud appearance and centrilobular nodules may suggest atypical mycobacterial infection. 3.  Patchy areas  of atelectasis and lower lobe bronchiectasis.  Original Report Authenticated By: P. Loralie Champagne, M.D.   Medications: I have reviewed the patient's current medications. Scheduled Meds:    . albuterol  2.5 mg Nebulization BID  . enoxaparin  40 mg Subcutaneous Q24H  . Fluticasone-Salmeterol  1 puff Inhalation BID  . levofloxacin  750 mg Oral Daily  . nicotine  21 mg Transdermal Q24H  . pantoprazole  40 mg Oral Q1200   Continuous Infusions:   PRN Meds:.acetaminophen, acetaminophen, albuterol, albuterol, guaiFENesin-dextromethorphan, ondansetron (ZOFRAN) IV, ondansetron Assessment/Plan:   1. Community acquired pneumonia: Atypical vs viral. He continues to be afebrile and is satting well on RA. He has improved significantly since admission. Leucocytosis has resolved. His urine for legionella and streptococcal antigen was negative. Sputum gram stain shows normal  oropharyngeal flora.  PFT's were non diagnotic, 2D echo was normal. CT scan ( 3/26)showed significant improvement in bilateral patchy infiltrates. Plan:  -Continue robitussin and PRN albuterol for symptomatic relief. -Continue him on advair and albuterol  inhalers.   2. Tobacco abuse: Counseled him on smoking cessation.   Continue nicotine patch.  3. DVT: Lovenox.  4. Dispo: D/C home today.   LOS: 8 days   Nagee Goates 01/14/2012, 8:47 AM

## 2012-01-28 ENCOUNTER — Encounter: Payer: 59 | Admitting: Internal Medicine

## 2012-02-03 ENCOUNTER — Encounter: Payer: 59 | Admitting: Internal Medicine

## 2012-03-02 ENCOUNTER — Encounter (HOSPITAL_COMMUNITY): Payer: Self-pay | Admitting: *Deleted

## 2012-03-02 ENCOUNTER — Emergency Department (HOSPITAL_COMMUNITY): Payer: Self-pay

## 2012-03-02 ENCOUNTER — Emergency Department (HOSPITAL_COMMUNITY)
Admission: EM | Admit: 2012-03-02 | Discharge: 2012-03-03 | Disposition: A | Discharge status: Home Health | Payer: Self-pay | Attending: Emergency Medicine | Admitting: Emergency Medicine

## 2012-03-02 DIAGNOSIS — R6884 Jaw pain: Secondary | ICD-10-CM | POA: Insufficient documentation

## 2012-03-02 DIAGNOSIS — S02400A Malar fracture unspecified, initial encounter for closed fracture: Secondary | ICD-10-CM | POA: Insufficient documentation

## 2012-03-02 DIAGNOSIS — R51 Headache: Secondary | ICD-10-CM | POA: Insufficient documentation

## 2012-03-02 DIAGNOSIS — R404 Transient alteration of awareness: Secondary | ICD-10-CM | POA: Insufficient documentation

## 2012-03-02 DIAGNOSIS — M2550 Pain in unspecified joint: Secondary | ICD-10-CM | POA: Insufficient documentation

## 2012-03-02 DIAGNOSIS — R220 Localized swelling, mass and lump, head: Secondary | ICD-10-CM | POA: Insufficient documentation

## 2012-03-02 DIAGNOSIS — S0003XA Contusion of scalp, initial encounter: Secondary | ICD-10-CM | POA: Insufficient documentation

## 2012-03-02 DIAGNOSIS — S01501A Unspecified open wound of lip, initial encounter: Secondary | ICD-10-CM | POA: Insufficient documentation

## 2012-03-02 DIAGNOSIS — R059 Cough, unspecified: Secondary | ICD-10-CM | POA: Insufficient documentation

## 2012-03-02 DIAGNOSIS — S02411A LeFort I fracture, initial encounter for closed fracture: Secondary | ICD-10-CM

## 2012-03-02 DIAGNOSIS — R05 Cough: Secondary | ICD-10-CM | POA: Insufficient documentation

## 2012-03-02 DIAGNOSIS — J3489 Other specified disorders of nose and nasal sinuses: Secondary | ICD-10-CM | POA: Insufficient documentation

## 2012-03-02 DIAGNOSIS — M542 Cervicalgia: Secondary | ICD-10-CM | POA: Insufficient documentation

## 2012-03-02 DIAGNOSIS — S060X1A Concussion with loss of consciousness of 30 minutes or less, initial encounter: Secondary | ICD-10-CM | POA: Insufficient documentation

## 2012-03-02 DIAGNOSIS — S02401A Maxillary fracture, unspecified, initial encounter for closed fracture: Secondary | ICD-10-CM | POA: Insufficient documentation

## 2012-03-02 MED ORDER — CEPHALEXIN 500 MG PO CAPS: 500.0000 mg | ORAL_CAPSULE | Freq: Four times a day (QID) | ORAL | Status: AC

## 2012-03-02 MED ORDER — HYDROCODONE-ACETAMINOPHEN 5-325 MG PO TABS
1.0000 | ORAL_TABLET | Freq: Once | ORAL | Status: AC
  Administered 2012-03-03: 1 via ORAL
  Filled 2012-03-02: qty 1

## 2012-03-02 MED ORDER — HYDROCODONE-ACETAMINOPHEN 5-325 MG PO TABS: 1.0000 | ORAL_TABLET | ORAL | Status: AC | PRN

## 2012-03-02 MED ORDER — HYDROCODONE-ACETAMINOPHEN 5-325 MG PO TABS
1.0000 | ORAL_TABLET | Freq: Once | ORAL | Status: AC
  Administered 2012-03-02: 1 via ORAL
  Filled 2012-03-02: qty 1

## 2012-03-02 MED ORDER — CEPHALEXIN 500 MG PO CAPS
500.0000 mg | ORAL_CAPSULE | Freq: Once | ORAL | Status: AC
  Administered 2012-03-03: 500 mg via ORAL
  Filled 2012-03-02: qty 1

## 2012-03-02 NOTE — Discharge Instructions (Signed)
Concussion and Brain Injury A blow to the head can stop the brain from working normally (concussion). It is usually not life-threatening. However, the results of the injury can be serious. Problems caused by the injury might show up right away or days or weeks later. Getting better might take some time. HOME CARE  Rest your body. Ways to rest your body include:   Getting plenty of sleep at night.   Going to sleep early.   Taking naps during the day when you feel tired.   Limit activities that require a lot of thought. This includes:   Time spent with homework.   Time spent with work related to a job.   TV watching.   Computer use.   Return to normal activities (driving, work, school) only when your doctor says it is okay.   Avoid high impact activity and sports until your doctor says it is okay.   Take medicines only as told by your doctor.   Do not drink alcohol until your doctor says it is okay.   Do not make important decisions without help until you feel better.   Follow up with your doctor as told.  GET HELP RIGHT AWAY IF:  You, your family, or your friends notice that:  You have bad headaches, or they get worse.   You have weakness, loss of feeling (numbness), or you feel off balance.   You keep throwing up (vomiting).   You feel tired or pass out (faint).   One black center of your eye (pupil) is larger than the other.   You twitch or shake (seize).   Your speech is not clear (slurred).   You are confused, restless, easily angered (agitated), or annoyed (irritable).   You cannot recognize or respond to people or activities.   You have neck pain.   You have trouble being woken up.   Your behavior changes.  MAKE SURE YOU:   Understand these instructions.   Will watch your condition.   Will get help right away if you are not doing well or get worse.  Document Released: 09/23/2009 Document Revised: 09/24/2011 Document Reviewed:  09/23/2009 [facility] Patient Information 2012 [place], [place].Facial Fracture A facial fracture is a break in one of the bones of your face. HOME CARE INSTRUCTIONS   Protect the injured part of your face until it is healed.   Do not participate in activities which give chance for re-injury until your doctor approves.   Gently wash and dry your face.   Wear head and facial protection while riding a bicycle, motorcycle, or snowmobile.  SEEK MEDICAL CARE IF:   An oral temperature above 102 F (38.9 C) develops.   You have severe headaches or notice changes in your vision.   You have new numbness or tingling in your face.   You develop nausea (feeling sick to your stomach), vomiting or a stiff neck.  SEEK IMMEDIATE MEDICAL CARE IF:   You develop difficulty seeing or experience double vision.   You become dizzy, lightheaded, or faint.   You develop trouble speaking, breathing, or swallowing.   You have a watery discharge from your nose or ear.  MAKE SURE YOU:   Understand these instructions.   Will watch your condition.   Will get help right away if you are not doing well or get worse.  Document Released: 10/05/2005 Document Revised: 09/24/2011 Document Reviewed: 05/24/2008 [facility] Patient Information 2012 [place], [place].Facial Fracture A facial fracture is a break in one of  the bones of your face. HOME CARE INSTRUCTIONS   Protect the injured part of your face until it is healed.   Do not participate in activities which give chance for re-injury until your doctor approves.   Gently wash and dry your face.   Wear head and facial protection while riding a bicycle, motorcycle, or snowmobile.  SEEK MEDICAL CARE IF:   An oral temperature above 102 F (38.9 C) develops.   You have severe headaches or notice changes in your vision.   You have new numbness or tingling in your face.   You develop nausea (feeling sick to your stomach), vomiting or a stiff neck.  SEEK  IMMEDIATE MEDICAL CARE IF:   You develop difficulty seeing or experience double vision.   You become dizzy, lightheaded, or faint.   You develop trouble speaking, breathing, or swallowing.   You have a watery discharge from your nose or ear.  MAKE SURE YOU:   Understand these instructions.   Will watch your condition.   Will get help right away if you are not doing well or get worse.  Document Released: 10/05/2005 Document Revised: 09/24/2011 Document Reviewed: 05/24/2008 [facility] Patient Information 2012 [place], [place].
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
## 2012-03-03 NOTE — ED Provider Notes (Signed)
Medical screening examination/treatment/procedure(s) were performed by non-physician practitioner and as supervising physician I was immediately available for consultation/collaboration.  Javad Salva, MD 03/03/12 0004 

## 2012-05-27 ENCOUNTER — Encounter (HOSPITAL_COMMUNITY): Payer: Self-pay | Admitting: Emergency Medicine

## 2012-05-27 ENCOUNTER — Emergency Department (HOSPITAL_COMMUNITY)
Admission: EM | Admit: 2012-05-27 | Discharge: 2012-05-28 | Disposition: A | Discharge status: Other Acute Inpatient Hospital | Payer: Self-pay | Attending: Emergency Medicine | Admitting: Emergency Medicine

## 2012-05-27 DIAGNOSIS — F17200 Nicotine dependence, unspecified, uncomplicated: Secondary | ICD-10-CM | POA: Insufficient documentation

## 2012-05-27 DIAGNOSIS — S61509A Unspecified open wound of unspecified wrist, initial encounter: Secondary | ICD-10-CM | POA: Insufficient documentation

## 2012-05-27 DIAGNOSIS — T1491XA Suicide attempt, initial encounter: Secondary | ICD-10-CM

## 2012-05-27 DIAGNOSIS — X789XXA Intentional self-harm by unspecified sharp object, initial encounter: Secondary | ICD-10-CM | POA: Insufficient documentation

## 2012-05-27 DIAGNOSIS — S61519A Laceration without foreign body of unspecified wrist, initial encounter: Secondary | ICD-10-CM

## 2012-05-27 LAB — CBC WITH DIFFERENTIAL/PLATELET
Basophils Relative: 1 % (ref 0–1)
Eosinophils Relative: 0 % (ref 0–5)
HCT: 46.6 % (ref 39.0–52.0)
Hemoglobin: 16.2 g/dL (ref 13.0–17.0)
Lymphs Abs: 1.7 10*3/uL (ref 0.7–4.0)
MCH: 31.5 pg (ref 26.0–34.0)
MCV: 90.5 fL (ref 78.0–100.0)
Monocytes Absolute: 0.6 10*3/uL (ref 0.1–1.0)
Neutro Abs: 4.7 10*3/uL (ref 1.7–7.7)
RBC: 5.15 MIL/uL (ref 4.22–5.81)

## 2012-05-27 LAB — COMPREHENSIVE METABOLIC PANEL
CO2: 24 mEq/L (ref 19–32)
Calcium: 9.9 mg/dL (ref 8.4–10.5)
Creatinine, Ser: 0.74 mg/dL (ref 0.50–1.35)
GFR calc Af Amer: >90 mL/min (ref >90)
GFR calc non Af Amer: >90 mL/min (ref >90)
Glucose, Bld: 99 mg/dL (ref 70–99)

## 2012-05-27 LAB — RAPID URINE DRUG SCREEN, HOSP PERFORMED
Amphetamines: NOT DETECTED
Opiates: NOT DETECTED

## 2012-05-27 MED ORDER — LORAZEPAM 1 MG PO TABS
1.0000 mg | ORAL_TABLET | Freq: Three times a day (TID) | ORAL | Status: DC | PRN
  Administered 2012-05-27: 1 mg via ORAL
  Filled 2012-05-27: qty 1

## 2012-05-27 MED ORDER — ACETAMINOPHEN 325 MG PO TABS: 650.0000 mg | ORAL_TABLET | ORAL | Status: DC | PRN

## 2012-05-27 MED ORDER — IBUPROFEN 600 MG PO TABS
600.0000 mg | ORAL_TABLET | Freq: Three times a day (TID) | ORAL | Status: DC | PRN
  Administered 2012-05-28: 600 mg via ORAL
  Filled 2012-05-27 (×2): qty 1

## 2012-05-27 NOTE — ED Notes (Signed)
Pt moved to Psych ED 39. One bag of belongings to locker 39.

## 2012-05-27 NOTE — ED Notes (Signed)
Left wrist wound bandaged.

## 2012-05-27 NOTE — ED Notes (Signed)
Patient complains of increased anxiety. Patient will be medicated per MAR. 

## 2012-05-27 NOTE — ED Notes (Signed)
Patient placed in blue scrubs and wanded by security. Belongings are sitting under desk at nursing station at this time.

## 2012-05-27 NOTE — ED Notes (Signed)
Report given to Jennifer.

## 2012-05-27 NOTE — ED Notes (Signed)
Pt has 3 superficial lacerations on left wrist and one that is deeper.
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
## 2012-05-27 NOTE — ED Notes (Signed)
Oriented pt. To psych ed., drink/dinner given.

## 2012-05-27 NOTE — ED Notes (Signed)
Bed:[facility]A<BR> Expected date:05/27/12<BR> Expected time: 4:07 PM<BR> Means of arrival:Ambulance<BR> Comments:<BR> SI/wrist lac

## 2012-05-27 NOTE — ED Notes (Signed)
Pt attempted to "kill himself" by slashing left wrist 3-4 times. Bleeding controlled. Pt agitated, hyper.  Upset because his girlfriend left him.

## 2012-05-27 NOTE — ED Notes (Signed)
Pt moved to [facility] for suturing.
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
## 2012-05-28 ENCOUNTER — Encounter (HOSPITAL_COMMUNITY): Payer: Self-pay

## 2012-05-28 ENCOUNTER — Inpatient Hospital Stay (HOSPITAL_COMMUNITY)
Admission: AD | Admit: 2012-05-28 | Discharge: 2012-05-30 | DRG: 882 | Disposition: A | Discharge status: Home / Self Care | Payer: Federal, State, Local not specified - Other | Source: Ambulatory Visit | Attending: Psychiatry | Admitting: Psychiatry

## 2012-05-28 DIAGNOSIS — Z720 Tobacco use: Secondary | ICD-10-CM

## 2012-05-28 DIAGNOSIS — F17200 Nicotine dependence, unspecified, uncomplicated: Secondary | ICD-10-CM | POA: Diagnosis present

## 2012-05-28 DIAGNOSIS — X789XXA Intentional self-harm by unspecified sharp object, initial encounter: Secondary | ICD-10-CM | POA: Diagnosis present

## 2012-05-28 DIAGNOSIS — F4325 Adjustment disorder with mixed disturbance of emotions and conduct: Principal | ICD-10-CM | POA: Diagnosis present

## 2012-05-28 MED ORDER — NICOTINE 21 MG/24HR TD PT24
21.0000 mg | MEDICATED_PATCH | Freq: Every day | TRANSDERMAL | Status: DC
  Administered 2012-05-29 – 2012-05-30 (×2): 21 mg via TRANSDERMAL
  Filled 2012-05-28 (×3): qty 1
  Filled 2012-05-28: qty 14
  Filled 2012-05-28: qty 1

## 2012-05-28 MED ORDER — MAGNESIUM HYDROXIDE 400 MG/5ML PO SUSP: 30.0000 mL | Freq: Every day | ORAL | Status: DC | PRN

## 2012-05-28 MED ORDER — NICOTINE 21 MG/24HR TD PT24
MEDICATED_PATCH | TRANSDERMAL | Status: AC
  Administered 2012-05-28: 21 mg
  Filled 2012-05-28: qty 1

## 2012-05-28 MED ORDER — TRAZODONE HCL 50 MG PO TABS: 50.0000 mg | ORAL_TABLET | Freq: Every evening | ORAL | Status: DC | PRN

## 2012-05-28 MED ORDER — ACETAMINOPHEN 325 MG PO TABS: 650.0000 mg | ORAL_TABLET | Freq: Four times a day (QID) | ORAL | Status: DC | PRN

## 2012-05-28 MED ORDER — ALUM & MAG HYDROXIDE-SIMETH 200-200-20 MG/5ML PO SUSP: 30.0000 mL | ORAL | Status: DC | PRN

## 2012-05-28 MED ORDER — NICOTINE 21 MG/24HR TD PT24
21.0000 mg | MEDICATED_PATCH | Freq: Every day | TRANSDERMAL | Status: DC
  Filled 2012-05-28: qty 1

## 2012-05-28 MED ORDER — TRAZODONE HCL 100 MG PO TABS: 100.0000 mg | ORAL_TABLET | Freq: Every evening | ORAL | Status: DC | PRN

## 2012-05-28 NOTE — ED Notes (Signed)
Awaiting for a tech to accompany security to transport pt to [facility].

## 2012-05-28 NOTE — Progress Notes (Signed)
[facility] Group Notes:  (Counselor/Nursing/MHT/Case Management/Adjunct)  05/28/2012 6:49 PM  Type of Therapy:  Group Therapy  Participation Level:  Did Not Attend    Neila Gear 05/28/2012, 6:49 PM

## 2012-05-28 NOTE — Progress Notes (Signed)
[facility] Group Notes:  (Counselor/Nursing/MHT/Case Management/Adjunct)  05/28/2012 11:43 PM  Type of Therapy:  Psychoeducational Skills  Participation Level:  Minimal  Participation Quality:  Inattentive  Affect:  Anxious  Cognitive:  Alert  Insight:  Limited  Engagement in Group:  Limited  Engagement in Therapy:  Limited  Modes of Intervention:  Education  Summary of Progress/Problems: The pt. Verbalized in group that he was feeling just fine and that he is not suicidal. He does not want to be a patient at this hospital and that he only needs to contact one person in order to straighten things out. He does admit to cutting his wrist, but he no longer felt the need to act in that manner. His goal for tomorrow is to get discharged and to somehow get more rest.    Tawania Daponte S 05/28/2012, 11:43 PM

## 2012-05-28 NOTE — Progress Notes (Signed)
D) This is a 41 year old WDM who is being admitted voluntarily to the service of Dr. Readings. Pt cut his wrist because "it was the only choice he had". Cut the wrist several times in different directions. Has sutures on one of the cuts. Area is dry and intact. Pt reports that he is in love with a women he has dated for awhile. The family of this women has custody of her children and will not allow this women to see the children unless she leaves the Patient. When Pt first came in he was angry and verbally talking about hurting others. He quickly regained his composure and stated that if he really wanted to hurt this family, he would have done it. "I know here they live, if I really wanted to hurt them, I would have done it by now. I know that I can't hurt them and I can't hurt her, that's why this (pointing to cut wrist) was the only choice I had." Has no know allergies, and was not on any medications prior to admission. Is jobless, but has an apartment that he shares with a roommate. A) Provided with a 1:1. Given support and reassurance. Oriented to the unit. R) Pt presently denies SI and HI. Adjusting well to the unit.

## 2012-05-28 NOTE — ED Provider Notes (Signed)
Pt alert, content, nad. Discussed w act team - states can be put up for transfer to [facility], pt accepted.   Suzi Roots, MD 05/28/12 1310

## 2012-05-28 NOTE — ED Notes (Signed)
Pt sitting in his bed eating his lunch.

## 2012-05-28 NOTE — ED Notes (Signed)
Pt is being escorted to [facility] via security and a tech with one bag of personal belongings.

## 2012-05-28 NOTE — Progress Notes (Signed)
D: Pt denies SI/HI/AVH; however pt is angry with girlfriend's family. He states that he will not harm them though. Pt is preoccupied with speaking with girlfriend. Pt states " I do not want to be in a psych hospital with someone checking on me every 15 minutes. This is not where I want to be. This is not what I need. I don't need meds and I don't need a psychiatrist. All I need is to talk to my girlfriend. I just want closure. If she doesn't want to talk to me then fine, but I want to hear her say it. She accepted me as her friend on Facebook so she must want to talk to me." Pt also states "I am tired of people asking me what happened to my arm. If I'm walking around with it wrapped up, what do they think happened." A: Support and encouragement offered to pt. Q 15 min checks continued for pt safety. R: Pt receptive. Pt remains safe on the unit.

## 2012-05-28 NOTE — ED Notes (Signed)
Pt is transporting to [facility] 504-1 accepted by Dr Allena Katz to Dr Dan Humphreys

## 2012-05-29 DIAGNOSIS — X789XXA Intentional self-harm by unspecified sharp object, initial encounter: Secondary | ICD-10-CM

## 2012-05-29 DIAGNOSIS — F4325 Adjustment disorder with mixed disturbance of emotions and conduct: Principal | ICD-10-CM

## 2012-05-29 HISTORY — DX: Intentional self-harm by unspecified sharp object, initial encounter: X78.9XXA

## 2012-05-29 NOTE — Progress Notes (Signed)
[facility] Group Notes:  (Counselor/Nursing/MHT/Case Management/Adjunct)  05/29/2012 11:34 AM  Type of Therapy:  After Care Planning Group  Pt. participated in after care planning group and was given [place] SI pamphlet and crisis and hotline numbers The patient agreed to use them if needed. The patient was given today work book on healthy support Systems and   The importance of having a support system after leaving [facility]. The pt. was also given a list of free support groups in [employer]. As well as information about the [facility] located in [place]. Patient was encouraged to attend support groups even if they have a doctor/psychiatrist and was encourage to reach out for support and to have as many supports as possible. Each pt. In group will complete the last sheet on listing supports in the workbook and will bring it back to group when they come back to the after noon group.  The pt. spoke about coming to [facility] yesterday evening and his SI attempt by cutting his wrist. Pt. stated he knows he made the " wrong choice" but that he was okay and that he wanted to be d/c . Pt. had just returned to group after seeing the PA , who told him that it would be up to the doctor as to when he could be d/c. The pt. states she did not want help or need help and does not have a therapist or doctor and does not want a referral for one. The pt. also stated that  He does not take medication and does not want medication.  Pt. denied SI/HI today.    Lamar Blinks Kemmerer 05/29/2012, 11:34 AM

## 2012-05-29 NOTE — Progress Notes (Signed)
Psychoeducational Group Note  Date:  05/29/2012 Time:  1515  Group Topic/Focus:  Healthy Communication:   The focus of this group is to discuss communication, barriers to communication, as well as healthy ways to communicate with others.  Participation Level:  Minimal  Participation Quality:  Appropriate and Attentive  Affect:  Appropriate and Blunted  Cognitive:  Alert, Appropriate and Oriented  Insight:  Limited  Engagement in Group:  Limited  Additional Comments:  Daelan was quiet while group discussed positive versus negative communication skills, defined "I feel" statements, practiced using them and read through examples of "I feel" statements.   Wandra Scot 05/29/2012, 6:45 PM

## 2012-05-29 NOTE — [facility] Suicide Risk Assessment (Signed)
Suicide Risk Assessment  Admission Assessment     Demographic factors:  Assessment Details Time of Assessment: Admission Information Obtained From: Patient Current Mental Status:  See below Loss Factors:  Loss Factors: Loss of significant relationship Historical Factors:  Historical Factors: Prior suicide attempts Risk Reduction Factors:  Risk Reduction Factors: Sense of responsibility to family  CLINICAL FACTORS:   Depression:   Impulsivity  COGNITIVE FEATURES THAT CONTRIBUTE TO RISK:  Closed-mindedness    SUICIDE RISK:   Moderate:  Frequent suicidal ideation with limited intensity, and duration, some specificity in terms of plans, no associated intent, good self-control, limited dysphoria/symptomatology, some risk factors present, and identifiable protective factors, including available and accessible social support.  PLAN OF CARE:  Mental Status Examination/Evaluation:  Objective: Appearance: Fairly Groomed   Psychomotor Activity: Normal   Eye Contact:: Good   Speech: Clear and Coherent and Normal Rate   Volume: Normal   Mood: ok  Affect: Appropriate   Thought Process: clear rational goal oriented  Orientation: Full   Thought Content: No AVH/psychosis   Suicidal Thoughts: None now but has has when he came  Homicidal Thoughts: No   Judgement: poor  Insight: poor  DIAGNOSIS:  AXIS I  Adjustment Disorder with Mixed Disturbance of Emotions and Conduct   AXIS II  Deferred   AXIS III  See medical history.   AXIS IV  economic problems, housing problems, occupational problems and other psychosocial or environmental problems   AXIS V  35  Treatment Plan Summary:  Admit for safety & stabilization      Wonda Cerise 05/29/2012, 5:46 PM

## 2012-05-29 NOTE — H&P (Signed)
  Pt was seen by me today and I agree with the key elements documented in H&P.  
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
## 2012-05-29 NOTE — Progress Notes (Signed)
Patient ID: Nicholas Hanson, male   DOB: Jul 30, 1971, 41 y.o.   MRN: [id number]  Problem: Depression  D: Pt isolative and withdrawn, with dull, flat affect. Patient with minimal interaction.  A: Monitor patient Q 15 minutes for safety, encourage staff/peer interaction and group participation.  R: Pt did not attend group, withdrawn and stating "I'm ready to leave in the morning". Pt continues to endorse depression, but denies SI or plans to harm himself at this time.

## 2012-05-29 NOTE — Progress Notes (Signed)
Psychoeducational Group Note  Date:  05/29/2012 Time: 1015 Group Topic/Focus:  Making Healthy Choices:   The focus of this group is to help patients identify negative/unhealthy choices they were using prior to admission and identify positive/healthier coping strategies to replace them upon discharge.  Participation Level:  Did Not Attend   Additional Comments:   Rich Brave 2:31 PM. 05/29/2012

## 2012-05-29 NOTE — Progress Notes (Signed)
[facility] Adult Inpatient Family/Significant Other Suicide Prevention Education  Suicide Prevention Education:  Patient Refusal for Family/Significant Other Suicide Prevention Education: The patient Nicholas Hanson has refused to provide written consent for family/significant other to be provided Family/Significant Other Suicide Prevention Education during admission and/or prior to discharge.  Physician notified.  Lamar Blinks Kimmell 05/29/2012, 9:58 AM

## 2012-05-29 NOTE — Progress Notes (Signed)
Psychoeducational Group Note  Date:  05/29/2012 Time:  1000  Group Topic/Focus:  Spirituality:   The focus of this group is to discuss how one's spirituality can aide in recovery.  Participation Level:  Minimal  Participation Quality:  Appropriate, Attentive, Redirectable and Resistant  Affect:  Irritable  Cognitive:  Alert, Appropriate and Oriented  Insight:  Good  Engagement in Group:  Limited  Additional Comments:  Group read handout about being overly self-reliant. Nicholas Hanson was quiet during discission on negatives of being overly self reliant and ways they can reach out in the future. Nicholas Hanson was able to list one way he can fill his spirit.  Wandra Scot 05/29/2012, 11:11 AM

## 2012-05-29 NOTE — Progress Notes (Signed)
D) Pt rates his depression at a 3 and his hopelessness at a 1. Denies SI and HI. Has not attended the groups, gets up for meals and then goes back to bed. Pt states that he does not want to be here and that he shouldn't be here. Wants to be able to speak with his X girlfriend. A) Given encouragement to go to the groups without success. Given support. R) Does not want to be here and does not want to participate.

## 2012-05-30 MED ORDER — NICOTINE 21 MG/24HR TD PT24: 1.0000 | MEDICATED_PATCH | Freq: Every day | TRANSDERMAL | Status: AC

## 2012-05-30 NOTE — Tx Team (Signed)
Interdisciplinary Treatment Plan Update (Adult)  Date:  05/30/2012  Time Reviewed:  10:30 AM   Progress in Treatment: Attending groups: Yes Participating in groups:  Yes Taking medication as prescribed:  Yes Tolerating medication: Yes Family/Significant othe contact made:  No, [name] has refused to allow contact Patient understands diagnosis: Yes Discussing patient identified problems/goals with staff:  Yes Medical problems stabilized or resolved: Yes Denies suicidal/homicidal ideation: Yes Issues/concerns per patient self-inventory:  No  Other:  New problem(s) identified: None  Reason for Continuation of Hospitalization:   Interventions implemented related to continuation of hospitalization:  Medication stabilization, safety checks q 15 mins, group attendance  Additional comments:  Estimated length of stay: appropriate for discharge  Discharge Plan:  Discharge home and follow up with [facility]  New goal(s):  Review of initial/current patient goals per problem list:   1.  Goal(s): Decrease rating of depressive symptoms to 4 or less  Met:  Yes  Target date: by discharge  As evidenced by: Nicholas Hanson rates depression at 0 today  2.  Goal (s): Decrease rating of anxiety symptoms to 4 or less  Met:  Yes  Target date: by discharge  As evidenced by: Nicholas Hanson rates anxiety at 0 today  3.  Goal(s):  Reduce potential for suicide/self-harm  Met:  Yes  Target date: by discharge  As evidenced by: Nicholas Hanson denies andy suicidal thoughts today  4.  Goal(s): Medication stabilization  Met:  Yes  Target date: by discharge  As evidenced by: Nicholas Hanson reports medications have been working and decreased symptoms  Attendees: Patient:     Family:     Physician:  Dr Orson Aloe, MD 05/30/2012 10:30 AM  Nursing:   Neill Loft, RN 05/30/2012 10:30 AM  Case Manager:  Juline Patch, LCSW 05/30/2012 10:30 AM  Counselor:  Angus Palms, LCSW 05/30/2012 10:30 AM  Other:  Reyes Ivan,  LCSWA 05/30/2012 10:30 AM  Other:     Other:     Other:      Scribe for Treatment Team:   Billie Lade, 05/30/2012 10:30 AM
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
## 2012-05-30 NOTE — Progress Notes (Signed)
[facility] Group Notes:  (Counselor/Nursing/MHT/Case Management/Adjunct)  05/30/2012 3:31 AM  Type of Therapy:  Psychoeducational Skills  Participation Level:  Active  Participation Quality:  Intrusive  Affect:  Anxious  Cognitive:  Appropriate  Insight:  Limited  Engagement in Group:  Limited  Engagement in Therapy:  Limited  Modes of Intervention:  Education  Summary of Progress/Problems: The pt. verbalized that he had an okay day. He then went into great length in explaining that he is no longer suicidal and that if he felt the need to harm himself, then he would go in to speak with someone. In addition, he mentioned that he is okay with his relationship with his girlfriend whether it works out or not. His goal for tomorrow is to get discharged from the hospital.    Westly Pam 05/30/2012, 3:31 AM

## 2012-05-30 NOTE — Progress Notes (Signed)
Patient ID: Nicholas Hanson, male   DOB: [birth year], 41 y.o.   MRN: [id number] Patient cooperative during d/c process.  Reviewed all f/u and medication information. Patient verbalized understanding of all instruction.  Denies SI/HI and stated all treatment goals were met.  All belongings returned. Escorted to lobby with bus pass and instrution.

## 2012-05-30 NOTE — Progress Notes (Signed)
[facility] Case Management Discharge Plan:  Will you be returning to the same living situation after discharge: Yes,  Patient will retun to his home with roommate At discharge, do you have transportation home?:Yes,  Patient to be assisted with a bus pass home Do you have the ability to pay for your medications:No.Patient will be assisted with indigent medications  Interagency Information:     Release of information consent forms completed and in the chart;  Patient's signature needed at discharge.  Patient to Follow up at:  Follow-up Information    Follow up with [facility] on 05/31/2012. (Please go to [facility] walk-in clinic on Tuesday, May 31, 2012 or any weekday between 8AM -3PM)    Contact information:   [address] [place], [place]  [address]  [phone]         Patient denies SI/HI:   Yes,  Patient no longer endorsing SI/HI or other thoughts of self harm    Safety Planning and Suicide Prevention discussed:  Yes,  Reviewed during aftercare group  Barrier to discharge identified:Yes,  Relational - limited support  Summary and Recommendation:  Patient encourage to be compliant with medications and follow up with outpatient recommendations     Wynn Banker 05/30/2012, 11:04 AM

## 2012-05-30 NOTE — Progress Notes (Signed)
Psychoeducational Group Note  Date:  05/30/2012 Time:  1100  Group Topic/Focus:  Wellness Toolbox:   The focus of this group is to discuss various aspects of wellness, balancing those aspects and exploring ways to increase the ability to experience wellness.  Patients will create a wellness toolbox for use upon discharge.  Participation Level:  Did Not Attend  Participation Quality:    Affect:    Cognitive:    Insight:    Engagement in Group:    Additional Comments:  Pt was sleep.  Isla Pence M 05/30/2012, 1:40 PM

## 2012-05-31 NOTE — Progress Notes (Signed)
Patient Discharge Instructions:  After Visit Summary (AVS):   Faxed to:  05/31/2012 Psychiatric Admission Assessment Note:   Faxed to:  05/31/2012 Suicide Risk Assessment - Discharge Assessment:   Faxed to:  05/31/2012 Faxed/Sent to the Next Level Care provider:  05/31/2012  Faxed to [facility] @ [phone]  Heloise Purpura Eduard Clos, 05/31/2012, 4:42 PM
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
## 2013-07-09 DIAGNOSIS — G7111 Myotonic muscular dystrophy: Secondary | ICD-10-CM | POA: Diagnosis present

## 2013-08-08 DIAGNOSIS — F4310 Post-traumatic stress disorder, unspecified: Secondary | ICD-10-CM | POA: Diagnosis present

## 2015-07-25 DIAGNOSIS — F6381 Intermittent explosive disorder: Secondary | ICD-10-CM | POA: Diagnosis present

## 2015-08-21 DIAGNOSIS — F1410 Cocaine abuse, uncomplicated: Secondary | ICD-10-CM | POA: Insufficient documentation

## 2015-08-29 DIAGNOSIS — F17200 Nicotine dependence, unspecified, uncomplicated: Secondary | ICD-10-CM | POA: Insufficient documentation

## 2015-08-30 DIAGNOSIS — N529 Male erectile dysfunction, unspecified: Secondary | ICD-10-CM | POA: Insufficient documentation

## 2017-12-19 ENCOUNTER — Encounter (HOSPITAL_COMMUNITY): Payer: Self-pay | Admitting: *Deleted

## 2017-12-19 ENCOUNTER — Emergency Department (HOSPITAL_COMMUNITY): Payer: Medicare Other

## 2017-12-19 ENCOUNTER — Inpatient Hospital Stay (HOSPITAL_COMMUNITY)
Admission: EM | Admit: 2017-12-19 | Discharge: 2017-12-21 | DRG: 346 | Disposition: A | Discharge status: Home / Self Care | Payer: Medicare Other | Attending: General Surgery | Admitting: General Surgery

## 2017-12-19 DIAGNOSIS — F6089 Other specific personality disorders: Secondary | ICD-10-CM | POA: Diagnosis present

## 2017-12-19 DIAGNOSIS — Z9151 Personal history of suicidal behavior: Secondary | ICD-10-CM

## 2017-12-19 DIAGNOSIS — Z720 Tobacco use: Secondary | ICD-10-CM | POA: Diagnosis present

## 2017-12-19 DIAGNOSIS — F6381 Intermittent explosive disorder: Secondary | ICD-10-CM | POA: Diagnosis present

## 2017-12-19 DIAGNOSIS — K611 Rectal abscess: Secondary | ICD-10-CM

## 2017-12-19 DIAGNOSIS — Z8673 Personal history of transient ischemic attack (TIA), and cerebral infarction without residual deficits: Secondary | ICD-10-CM

## 2017-12-19 DIAGNOSIS — F17200 Nicotine dependence, unspecified, uncomplicated: Secondary | ICD-10-CM | POA: Diagnosis present

## 2017-12-19 DIAGNOSIS — Z8679 Personal history of other diseases of the circulatory system: Secondary | ICD-10-CM

## 2017-12-19 DIAGNOSIS — K612 Anorectal abscess: Secondary | ICD-10-CM | POA: Diagnosis not present

## 2017-12-19 DIAGNOSIS — K648 Other hemorrhoids: Secondary | ICD-10-CM | POA: Diagnosis present

## 2017-12-19 DIAGNOSIS — F308 Other manic episodes: Secondary | ICD-10-CM

## 2017-12-19 DIAGNOSIS — Z915 Personal history of self-harm: Secondary | ICD-10-CM

## 2017-12-19 DIAGNOSIS — G7111 Myotonic muscular dystrophy: Secondary | ICD-10-CM | POA: Diagnosis present

## 2017-12-19 DIAGNOSIS — F4310 Post-traumatic stress disorder, unspecified: Secondary | ICD-10-CM | POA: Diagnosis present

## 2017-12-19 DIAGNOSIS — K610 Anal abscess: Secondary | ICD-10-CM

## 2017-12-19 DIAGNOSIS — F1011 Alcohol abuse, in remission: Secondary | ICD-10-CM

## 2017-12-19 DIAGNOSIS — Z87828 Personal history of other (healed) physical injury and trauma: Secondary | ICD-10-CM

## 2017-12-19 HISTORY — DX: Myotonic muscular dystrophy: G71.11

## 2017-12-19 HISTORY — DX: Personal history of other (healed) physical injury and trauma: Z87.828

## 2017-12-19 HISTORY — DX: Pneumonia, unspecified organism: J18.9

## 2017-12-19 HISTORY — DX: Intentional self-harm by unspecified sharp object, initial encounter: X78.9XXA

## 2017-12-19 LAB — CBC WITH DIFFERENTIAL/PLATELET
BASOS ABS: 0 10*3/uL (ref 0.0–0.1)
BASOS PCT: 0 %
EOS ABS: 0 10*3/uL (ref 0.0–0.7)
EOS PCT: 0 %
HCT: 46.5 % (ref 39.0–52.0)
Hemoglobin: 15 g/dL (ref 13.0–17.0)
LYMPHS PCT: 17 %
Lymphs Abs: 1.9 10*3/uL (ref 0.7–4.0)
MCH: 28.6 pg (ref 26.0–34.0)
MCHC: 32.3 g/dL (ref 30.0–36.0)
MCV: 88.7 fL (ref 78.0–100.0)
MONO ABS: 1.1 10*3/uL — AB (ref 0.1–1.0)
Monocytes Relative: 10 %
Neutro Abs: 8.4 10*3/uL — ABNORMAL HIGH (ref 1.7–7.7)
Neutrophils Relative %: 73 %
PLATELETS: 221 10*3/uL (ref 150–400)
RBC: 5.24 MIL/uL (ref 4.22–5.81)
RDW: 15 % (ref 11.5–15.5)
WBC: 11.5 10*3/uL — AB (ref 4.0–10.5)

## 2017-12-19 LAB — I-STAT CHEM 8, ED
BUN: 3 mg/dL — ABNORMAL LOW (ref 6–20)
CREATININE: 0.8 mg/dL (ref 0.61–1.24)
Calcium, Ion: 1.2 mmol/L (ref 1.15–1.40)
Chloride: 103 mmol/L (ref 101–111)
Glucose, Bld: 110 mg/dL — ABNORMAL HIGH (ref 65–99)
HEMATOCRIT: 49 % (ref 39.0–52.0)
HEMOGLOBIN: 16.7 g/dL (ref 13.0–17.0)
POTASSIUM: 3.5 mmol/L (ref 3.5–5.1)
Sodium: 141 mmol/L (ref 135–145)
TCO2: 27 mmol/L (ref 22–32)

## 2017-12-19 LAB — BASIC METABOLIC PANEL
ANION GAP: 9 (ref 5–15)
BUN: 6 mg/dL (ref 6–20)
CALCIUM: 9.4 mg/dL (ref 8.9–10.3)
CO2: 27 mmol/L (ref 22–32)
Chloride: 106 mmol/L (ref 101–111)
Creatinine, Ser: 0.84 mg/dL (ref 0.61–1.24)
GFR calc Af Amer: >60 mL/min (ref >60)
Glucose, Bld: 111 mg/dL — ABNORMAL HIGH (ref 65–99)
POTASSIUM: 3.6 mmol/L (ref 3.5–5.1)
SODIUM: 142 mmol/L (ref 135–145)

## 2017-12-19 MED ORDER — ZINC OXIDE 40 % EX OINT
TOPICAL_OINTMENT | Freq: Once | CUTANEOUS | Status: AC
  Administered 2017-12-19: 1 via TOPICAL
  Filled 2017-12-19: qty 57

## 2017-12-19 MED ORDER — CLINDAMYCIN PHOSPHATE 900 MG/50ML IV SOLN
900.0000 mg | Freq: Once | INTRAVENOUS | Status: AC
  Administered 2017-12-19: 900 mg via INTRAVENOUS
  Filled 2017-12-19: qty 50

## 2017-12-19 MED ORDER — SODIUM CHLORIDE 0.9 % IJ SOLN
INTRAMUSCULAR | Status: AC
  Filled 2017-12-19: qty 50

## 2017-12-19 MED ORDER — FLUCONAZOLE 150 MG PO TABS
150.0000 mg | ORAL_TABLET | Freq: Once | ORAL | Status: AC
  Administered 2017-12-19: 150 mg via ORAL
  Filled 2017-12-19: qty 1

## 2017-12-19 MED ORDER — IOPAMIDOL (ISOVUE-300) INJECTION 61%
INTRAVENOUS | Status: AC
  Administered 2017-12-19: 100 mL
  Filled 2017-12-19: qty 100

## 2017-12-19 MED ORDER — MORPHINE SULFATE (PF) 4 MG/ML IV SOLN
4.0000 mg | Freq: Once | INTRAVENOUS | Status: DC
  Filled 2017-12-19: qty 1

## 2017-12-19 MED ORDER — KETOROLAC TROMETHAMINE 15 MG/ML IJ SOLN
15.0000 mg | Freq: Once | INTRAMUSCULAR | Status: AC
  Administered 2017-12-19: 15 mg via INTRAVENOUS
  Filled 2017-12-19: qty 1

## 2017-12-19 NOTE — ED Notes (Signed)
Pt. Requested to hold off on morphine, afraid that he will be drowsy or asleep once Surgeon is in his room once morphine is given, and he wont be able to sign  Consent.

## 2017-12-19 NOTE — ED Triage Notes (Signed)
Per EMS, pt complains of possible hemorrhoids. Pt states he has had pain while sitting for the past 3 days. Pt states he was constipated prior to having pain, states he is no longer constipated.
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
## 2017-12-19 NOTE — ED Notes (Signed)
Pt calling out cursing about wanting the Dr to get in there and get him to surgery. Pt stating he is not waiting to eat and he wants surgery right now. RN and Charge aware

## 2017-12-20 ENCOUNTER — Inpatient Hospital Stay (HOSPITAL_COMMUNITY): Payer: Medicare Other | Admitting: Registered Nurse

## 2017-12-20 ENCOUNTER — Encounter (HOSPITAL_COMMUNITY): Payer: Self-pay | Admitting: Surgery

## 2017-12-20 ENCOUNTER — Other Ambulatory Visit: Payer: Self-pay

## 2017-12-20 ENCOUNTER — Encounter (HOSPITAL_COMMUNITY): Admission: EM | Disposition: A | Discharge status: Home / Self Care | Payer: Self-pay | Source: Home / Self Care

## 2017-12-20 DIAGNOSIS — G7111 Myotonic muscular dystrophy: Secondary | ICD-10-CM | POA: Diagnosis present

## 2017-12-20 DIAGNOSIS — Z915 Personal history of self-harm: Secondary | ICD-10-CM | POA: Diagnosis not present

## 2017-12-20 DIAGNOSIS — F17200 Nicotine dependence, unspecified, uncomplicated: Secondary | ICD-10-CM | POA: Diagnosis present

## 2017-12-20 DIAGNOSIS — F6089 Other specific personality disorders: Secondary | ICD-10-CM | POA: Diagnosis present

## 2017-12-20 DIAGNOSIS — F308 Other manic episodes: Secondary | ICD-10-CM

## 2017-12-20 DIAGNOSIS — Z8673 Personal history of transient ischemic attack (TIA), and cerebral infarction without residual deficits: Secondary | ICD-10-CM | POA: Diagnosis not present

## 2017-12-20 DIAGNOSIS — K648 Other hemorrhoids: Secondary | ICD-10-CM | POA: Diagnosis present

## 2017-12-20 DIAGNOSIS — F4310 Post-traumatic stress disorder, unspecified: Secondary | ICD-10-CM | POA: Diagnosis present

## 2017-12-20 DIAGNOSIS — Z8679 Personal history of other diseases of the circulatory system: Secondary | ICD-10-CM

## 2017-12-20 DIAGNOSIS — F6381 Intermittent explosive disorder: Secondary | ICD-10-CM | POA: Diagnosis present

## 2017-12-20 DIAGNOSIS — K612 Anorectal abscess: Secondary | ICD-10-CM | POA: Diagnosis present

## 2017-12-20 DIAGNOSIS — K611 Rectal abscess: Secondary | ICD-10-CM | POA: Diagnosis present

## 2017-12-20 HISTORY — PX: RECTAL EXAM UNDER ANESTHESIA: SHX6399

## 2017-12-20 HISTORY — PX: INCISION AND DRAINAGE PERIRECTAL ABSCESS: SHX1804

## 2017-12-20 LAB — SURGICAL PCR SCREEN
MRSA, PCR: NEGATIVE
STAPHYLOCOCCUS AUREUS: NEGATIVE

## 2017-12-20 LAB — GC/CHLAMYDIA PROBE AMP (~~LOC~~) NOT AT ARMC
Chlamydia: NEGATIVE
Neisseria Gonorrhea: NEGATIVE

## 2017-12-20 SURGERY — INCISION AND DRAINAGE, ABSCESS, PERIRECTAL
Anesthesia: General

## 2017-12-20 MED ORDER — CELECOXIB 200 MG PO CAPS
200.0000 mg | ORAL_CAPSULE | ORAL | Status: DC
  Filled 2017-12-20 (×2): qty 1

## 2017-12-20 MED ORDER — DEXAMETHASONE SODIUM PHOSPHATE 10 MG/ML IJ SOLN
INTRAMUSCULAR | Status: AC
  Filled 2017-12-20: qty 1

## 2017-12-20 MED ORDER — OXYCODONE HCL 5 MG/5ML PO SOLN
5.0000 mg | Freq: Once | ORAL | Status: DC | PRN
  Filled 2017-12-20: qty 5

## 2017-12-20 MED ORDER — PROPOFOL 10 MG/ML IV BOLUS
INTRAVENOUS | Status: DC | PRN
  Administered 2017-12-20: 200 mg via INTRAVENOUS

## 2017-12-20 MED ORDER — ENSURE PRE-SURGERY PO LIQD
296.0000 mL | Freq: Once | ORAL | Status: AC
Start: 2017-12-20 — End: 2017-12-20
  Administered 2017-12-20: 296 mL via ORAL
  Filled 2017-12-20: qty 296

## 2017-12-20 MED ORDER — MIDAZOLAM HCL 5 MG/5ML IJ SOLN
INTRAMUSCULAR | Status: DC | PRN
  Administered 2017-12-20: 2 mg via INTRAVENOUS

## 2017-12-20 MED ORDER — LACTATED RINGERS IV SOLN
INTRAVENOUS | Status: DC | PRN
  Administered 2017-12-20: 10:00:00 via INTRAVENOUS

## 2017-12-20 MED ORDER — METOPROLOL TARTRATE 5 MG/5ML IV SOLN: 5.0000 mg | Freq: Four times a day (QID) | INTRAVENOUS | Status: DC | PRN

## 2017-12-20 MED ORDER — BUPIVACAINE-EPINEPHRINE (PF) 0.5% -1:200000 IJ SOLN
INTRAMUSCULAR | Status: AC
  Filled 2017-12-20: qty 30

## 2017-12-20 MED ORDER — ENOXAPARIN SODIUM 40 MG/0.4ML ~~LOC~~ SOLN
40.0000 mg | Freq: Every day | SUBCUTANEOUS | Status: DC
  Administered 2017-12-20: 40 mg via SUBCUTANEOUS
  Filled 2017-12-20 (×2): qty 0.4

## 2017-12-20 MED ORDER — LIDOCAINE 2% (20 MG/ML) 5 ML SYRINGE
INTRAMUSCULAR | Status: DC | PRN
  Administered 2017-12-20: 60 mg via INTRAVENOUS

## 2017-12-20 MED ORDER — ADULT MULTIVITAMIN W/MINERALS CH
1.0000 | ORAL_TABLET | Freq: Every day | ORAL | Status: DC
  Administered 2017-12-21: 1 via ORAL
  Filled 2017-12-20: qty 1

## 2017-12-20 MED ORDER — DEXAMETHASONE SODIUM PHOSPHATE 10 MG/ML IJ SOLN
INTRAMUSCULAR | Status: DC | PRN
  Administered 2017-12-20: 10 mg via INTRAVENOUS

## 2017-12-20 MED ORDER — KETOROLAC TROMETHAMINE 30 MG/ML IJ SOLN: 30.0000 mg | Freq: Once | INTRAMUSCULAR | Status: DC | PRN

## 2017-12-20 MED ORDER — GABAPENTIN 300 MG PO CAPS
300.0000 mg | ORAL_CAPSULE | ORAL | Status: DC
  Filled 2017-12-20: qty 1

## 2017-12-20 MED ORDER — ACETAMINOPHEN 500 MG PO TABS
1000.0000 mg | ORAL_TABLET | ORAL | Status: DC
  Filled 2017-12-20: qty 2

## 2017-12-20 MED ORDER — SCOPOLAMINE 1 MG/3DAYS TD PT72
1.0000 | MEDICATED_PATCH | TRANSDERMAL | Status: DC
  Administered 2017-12-20: 1.5 mg via TRANSDERMAL
  Filled 2017-12-20: qty 1

## 2017-12-20 MED ORDER — FENTANYL CITRATE (PF) 250 MCG/5ML IJ SOLN
INTRAMUSCULAR | Status: AC
  Filled 2017-12-20: qty 5

## 2017-12-20 MED ORDER — OXYCODONE HCL 5 MG PO TABS: 5.0000 mg | ORAL_TABLET | ORAL | Status: DC | PRN

## 2017-12-20 MED ORDER — BUPIVACAINE HCL (PF) 0.5 % IJ SOLN
INTRAMUSCULAR | Status: AC
  Filled 2017-12-20: qty 30

## 2017-12-20 MED ORDER — HYDROMORPHONE HCL 1 MG/ML IJ SOLN
0.5000 mg | INTRAMUSCULAR | Status: DC | PRN
  Administered 2017-12-20 – 2017-12-21 (×2): 2 mg via INTRAVENOUS
  Filled 2017-12-20 (×2): qty 2

## 2017-12-20 MED ORDER — PHENOL 1.4 % MT LIQD
1.0000 | OROMUCOSAL | Status: DC | PRN
  Filled 2017-12-20: qty 177

## 2017-12-20 MED ORDER — ZINC OXIDE 40 % EX OINT
TOPICAL_OINTMENT | Freq: Two times a day (BID) | CUTANEOUS | Status: DC
  Administered 2017-12-21: 11:00:00 via TOPICAL
  Filled 2017-12-20 (×2): qty 57

## 2017-12-20 MED ORDER — DIPHENHYDRAMINE HCL 50 MG/ML IJ SOLN: 12.5000 mg | Freq: Four times a day (QID) | INTRAMUSCULAR | Status: DC | PRN

## 2017-12-20 MED ORDER — PIPERACILLIN-TAZOBACTAM 3.375 G IVPB
3.3750 g | Freq: Three times a day (TID) | INTRAVENOUS | Status: DC
  Administered 2017-12-20 – 2017-12-21 (×4): 3.375 g via INTRAVENOUS
  Filled 2017-12-20 (×4): qty 50

## 2017-12-20 MED ORDER — DEXAMETHASONE SODIUM PHOSPHATE 4 MG/ML IJ SOLN
4.0000 mg | INTRAMUSCULAR | Status: DC
  Filled 2017-12-20: qty 1

## 2017-12-20 MED ORDER — METRONIDAZOLE IN NACL 5-0.79 MG/ML-% IV SOLN
500.0000 mg | INTRAVENOUS | Status: AC
  Administered 2017-12-20: 500 mg via INTRAVENOUS
  Filled 2017-12-20: qty 100

## 2017-12-20 MED ORDER — ENSURE PRE-SURGERY PO LIQD: 296.0000 mL | Freq: Once | ORAL | Status: DC

## 2017-12-20 MED ORDER — BUPIVACAINE-EPINEPHRINE (PF) 0.5% -1:200000 IJ SOLN
INTRAMUSCULAR | Status: DC | PRN
  Administered 2017-12-20: 10 mL

## 2017-12-20 MED ORDER — LIDOCAINE HCL (PF) 1 % IJ SOLN
INTRAMUSCULAR | Status: AC
  Filled 2017-12-20: qty 30

## 2017-12-20 MED ORDER — LORAZEPAM 2 MG/ML IJ SOLN: 0.5000 mg | Freq: Three times a day (TID) | INTRAMUSCULAR | Status: DC | PRN

## 2017-12-20 MED ORDER — METOPROLOL TARTRATE 12.5 MG HALF TABLET: 12.5000 mg | ORAL_TABLET | Freq: Two times a day (BID) | ORAL | Status: DC | PRN

## 2017-12-20 MED ORDER — ALUM & MAG HYDROXIDE-SIMETH 200-200-20 MG/5ML PO SUSP: 30.0000 mL | Freq: Four times a day (QID) | ORAL | Status: DC | PRN

## 2017-12-20 MED ORDER — PIPERACILLIN-TAZOBACTAM 3.375 G IVPB 30 MIN
3.3750 g | Freq: Once | INTRAVENOUS | Status: AC
  Administered 2017-12-20: 3.375 g via INTRAVENOUS
  Filled 2017-12-20: qty 50

## 2017-12-20 MED ORDER — BUPIVACAINE LIPOSOME 1.3 % IJ SUSP
20.0000 mL | INTRAMUSCULAR | Status: AC
  Filled 2017-12-20: qty 20

## 2017-12-20 MED ORDER — ENSURE SURGERY PO LIQD
237.0000 mL | Freq: Two times a day (BID) | ORAL | Status: DC
  Administered 2017-12-20 – 2017-12-21 (×2): 237 mL via ORAL
  Filled 2017-12-20 (×5): qty 237

## 2017-12-20 MED ORDER — ACETAMINOPHEN 500 MG PO TABS
1000.0000 mg | ORAL_TABLET | ORAL | Status: AC
  Administered 2017-12-20: 1000 mg via ORAL

## 2017-12-20 MED ORDER — METRONIDAZOLE IN NACL 5-0.79 MG/ML-% IV SOLN
INTRAVENOUS | Status: AC
  Filled 2017-12-20: qty 100

## 2017-12-20 MED ORDER — CHLORHEXIDINE GLUCONATE CLOTH 2 % EX PADS
6.0000 | MEDICATED_PAD | Freq: Once | CUTANEOUS | Status: AC
  Administered 2017-12-20: 6 via TOPICAL

## 2017-12-20 MED ORDER — ONDANSETRON HCL 4 MG/2ML IJ SOLN: 4.0000 mg | Freq: Four times a day (QID) | INTRAMUSCULAR | Status: DC | PRN

## 2017-12-20 MED ORDER — SCOPOLAMINE 1 MG/3DAYS TD PT72
1.0000 | MEDICATED_PATCH | TRANSDERMAL | Status: DC
  Filled 2017-12-20: qty 1

## 2017-12-20 MED ORDER — PROCHLORPERAZINE EDISYLATE 5 MG/ML IJ SOLN: 5.0000 mg | INTRAMUSCULAR | Status: DC | PRN

## 2017-12-20 MED ORDER — IBUPROFEN 200 MG PO TABS: 400.0000 mg | ORAL_TABLET | Freq: Four times a day (QID) | ORAL | Status: DC | PRN

## 2017-12-20 MED ORDER — GABAPENTIN 300 MG PO CAPS
300.0000 mg | ORAL_CAPSULE | Freq: Every day | ORAL | Status: DC
  Administered 2017-12-20: 300 mg via ORAL
  Filled 2017-12-20: qty 1

## 2017-12-20 MED ORDER — SIMETHICONE 80 MG PO CHEW
40.0000 mg | CHEWABLE_TABLET | Freq: Four times a day (QID) | ORAL | Status: DC | PRN
  Filled 2017-12-20: qty 1

## 2017-12-20 MED ORDER — GUAIFENESIN-DM 100-10 MG/5ML PO SYRP: 10.0000 mL | ORAL_SOLUTION | ORAL | Status: DC | PRN

## 2017-12-20 MED ORDER — PSYLLIUM 95 % PO PACK
1.0000 | PACK | Freq: Two times a day (BID) | ORAL | Status: DC
  Administered 2017-12-20: 1 via ORAL
  Filled 2017-12-20 (×2): qty 1

## 2017-12-20 MED ORDER — FENTANYL CITRATE (PF) 100 MCG/2ML IJ SOLN
INTRAMUSCULAR | Status: DC | PRN
  Administered 2017-12-20 (×2): 50 ug via INTRAVENOUS
  Administered 2017-12-20: 100 ug via INTRAVENOUS
  Administered 2017-12-20: 50 ug via INTRAVENOUS

## 2017-12-20 MED ORDER — WITCH HAZEL-GLYCERIN EX PADS
1.0000 "application " | MEDICATED_PAD | CUTANEOUS | Status: DC | PRN
  Filled 2017-12-20: qty 100

## 2017-12-20 MED ORDER — LACTATED RINGERS IV SOLN
INTRAVENOUS | Status: DC
  Administered 2017-12-20 (×2): via INTRAVENOUS

## 2017-12-20 MED ORDER — ONDANSETRON HCL 4 MG/2ML IJ SOLN
INTRAMUSCULAR | Status: DC | PRN
  Administered 2017-12-20: 4 mg via INTRAVENOUS

## 2017-12-20 MED ORDER — MAGIC MOUTHWASH
15.0000 mL | Freq: Four times a day (QID) | ORAL | Status: DC | PRN
  Filled 2017-12-20: qty 15

## 2017-12-20 MED ORDER — LIDOCAINE HCL (PF) 1 % IJ SOLN
INTRAMUSCULAR | Status: DC | PRN
  Administered 2017-12-20: 10 mL

## 2017-12-20 MED ORDER — GABAPENTIN 300 MG PO CAPS
300.0000 mg | ORAL_CAPSULE | ORAL | Status: AC
  Administered 2017-12-20: 300 mg via ORAL

## 2017-12-20 MED ORDER — SODIUM CHLORIDE 0.9 % IV SOLN
25.0000 mg | Freq: Four times a day (QID) | INTRAVENOUS | Status: DC | PRN
  Filled 2017-12-20: qty 1

## 2017-12-20 MED ORDER — ACETAMINOPHEN 650 MG RE SUPP: 650.0000 mg | Freq: Four times a day (QID) | RECTAL | Status: DC | PRN

## 2017-12-20 MED ORDER — LACTATED RINGERS IV BOLUS (SEPSIS)
1000.0000 mL | Freq: Once | INTRAVENOUS | Status: AC
  Administered 2017-12-20: 1000 mL via INTRAVENOUS

## 2017-12-20 MED ORDER — ACETAMINOPHEN 500 MG PO TABS
1000.0000 mg | ORAL_TABLET | Freq: Three times a day (TID) | ORAL | Status: DC
  Administered 2017-12-20 – 2017-12-21 (×3): 1000 mg via ORAL
  Filled 2017-12-20 (×3): qty 2

## 2017-12-20 MED ORDER — DIPHENHYDRAMINE HCL 12.5 MG/5ML PO ELIX: 12.5000 mg | ORAL_SOLUTION | Freq: Four times a day (QID) | ORAL | Status: DC | PRN

## 2017-12-20 MED ORDER — HYDROCORTISONE ACE-PRAMOXINE 2.5-1 % RE CREA
1.0000 "application " | TOPICAL_CREAM | Freq: Four times a day (QID) | RECTAL | Status: DC | PRN
  Filled 2017-12-20: qty 30

## 2017-12-20 MED ORDER — OXYCODONE HCL 5 MG PO TABS: 5.0000 mg | ORAL_TABLET | Freq: Once | ORAL | Status: DC | PRN

## 2017-12-20 MED ORDER — SACCHAROMYCES BOULARDII 250 MG PO CAPS
250.0000 mg | ORAL_CAPSULE | Freq: Two times a day (BID) | ORAL | Status: DC
  Administered 2017-12-20 – 2017-12-21 (×2): 250 mg via ORAL
  Filled 2017-12-20 (×2): qty 1

## 2017-12-20 MED ORDER — 0.9 % SODIUM CHLORIDE (POUR BTL) OPTIME
TOPICAL | Status: DC | PRN
  Administered 2017-12-20: 1000 mL

## 2017-12-20 MED ORDER — ONDANSETRON 4 MG PO TBDP: 4.0000 mg | ORAL_TABLET | Freq: Four times a day (QID) | ORAL | Status: DC | PRN

## 2017-12-20 MED ORDER — ONDANSETRON HCL 4 MG/2ML IJ SOLN
INTRAMUSCULAR | Status: AC
  Filled 2017-12-20: qty 2

## 2017-12-20 MED ORDER — ACETAMINOPHEN 325 MG PO TABS: 650.0000 mg | ORAL_TABLET | Freq: Four times a day (QID) | ORAL | Status: DC | PRN

## 2017-12-20 MED ORDER — HYDRALAZINE HCL 20 MG/ML IJ SOLN: 5.0000 mg | INTRAMUSCULAR | Status: DC | PRN

## 2017-12-20 MED ORDER — LIDOCAINE 2% (20 MG/ML) 5 ML SYRINGE
INTRAMUSCULAR | Status: AC
  Filled 2017-12-20: qty 5

## 2017-12-20 MED ORDER — LIP MEDEX EX OINT
1.0000 "application " | TOPICAL_OINTMENT | Freq: Two times a day (BID) | CUTANEOUS | Status: DC
  Administered 2017-12-20 – 2017-12-21 (×2): 1 via TOPICAL
  Filled 2017-12-20 (×2): qty 7

## 2017-12-20 MED ORDER — FENTANYL CITRATE (PF) 100 MCG/2ML IJ SOLN: 25.0000 ug | INTRAMUSCULAR | Status: DC | PRN

## 2017-12-20 MED ORDER — LACTATED RINGERS IV BOLUS (SEPSIS): 1000.0000 mL | Freq: Three times a day (TID) | INTRAVENOUS | Status: DC | PRN

## 2017-12-20 MED ORDER — PROPOFOL 10 MG/ML IV BOLUS
INTRAVENOUS | Status: AC
  Filled 2017-12-20: qty 20

## 2017-12-20 MED ORDER — CELECOXIB 200 MG PO CAPS
200.0000 mg | ORAL_CAPSULE | ORAL | Status: AC
  Administered 2017-12-20: 200 mg via ORAL

## 2017-12-20 MED ORDER — HYDROCORTISONE 1 % EX CREA
1.0000 "application " | TOPICAL_CREAM | Freq: Three times a day (TID) | CUTANEOUS | Status: DC | PRN
  Filled 2017-12-20: qty 28

## 2017-12-20 MED ORDER — MIDAZOLAM HCL 2 MG/2ML IJ SOLN
INTRAMUSCULAR | Status: AC
  Filled 2017-12-20: qty 2

## 2017-12-20 MED ORDER — MENTHOL 3 MG MT LOZG
1.0000 | LOZENGE | OROMUCOSAL | Status: DC | PRN
  Filled 2017-12-20: qty 9

## 2017-12-20 MED ORDER — SODIUM CHLORIDE 0.9 % IV SOLN
8.0000 mg | Freq: Four times a day (QID) | INTRAVENOUS | Status: DC | PRN
  Filled 2017-12-20: qty 4

## 2017-12-20 MED ORDER — SODIUM CHLORIDE 0.9 % IV SOLN
2.0000 g | INTRAVENOUS | Status: AC
  Administered 2017-12-20: 2 g via INTRAVENOUS
  Filled 2017-12-20: qty 2
  Filled 2017-12-20: qty 20

## 2017-12-20 MED ORDER — PROMETHAZINE HCL 25 MG/ML IJ SOLN: 6.2500 mg | INTRAMUSCULAR | Status: DC | PRN

## 2017-12-20 MED ORDER — HYDROCORTISONE 2.5 % RE CREA
1.0000 "application " | TOPICAL_CREAM | Freq: Four times a day (QID) | RECTAL | Status: DC | PRN
  Filled 2017-12-20: qty 28.35

## 2017-12-20 SURGICAL SUPPLY — 15 items
COVER SURGICAL LIGHT HANDLE (MISCELLANEOUS) ×3 IMPLANT
GAUZE PACKING IODOFORM 1/2 (PACKING) ×3 IMPLANT
GAUZE SPONGE 4X4 12PLY STRL (GAUZE/BANDAGES/DRESSINGS) ×3 IMPLANT
GLOVE BIO SURGEON STRL SZ 6 (GLOVE) ×3 IMPLANT
GLOVE BIOGEL PI IND STRL 6.5 (GLOVE) ×1 IMPLANT
GLOVE BIOGEL PI IND STRL 7.0 (GLOVE) ×1 IMPLANT
GLOVE BIOGEL PI INDICATOR 6.5 (GLOVE) ×2
GLOVE BIOGEL PI INDICATOR 7.0 (GLOVE) ×2
GLOVE INDICATOR 6.5 STRL GRN (GLOVE) ×3 IMPLANT
GOWN STRL REUS W/ TWL XL LVL3 (GOWN DISPOSABLE) ×3 IMPLANT
GOWN STRL REUS W/TWL 2XL LVL3 (GOWN DISPOSABLE) ×3 IMPLANT
GOWN STRL REUS W/TWL XL LVL3 (GOWN DISPOSABLE) ×6
PACK BASIC (CUSTOM PROCEDURE TRAY) ×3 IMPLANT
PAD ABD 8X10 STRL (GAUZE/BANDAGES/DRESSINGS) ×3 IMPLANT
TOWEL OR 17X26 10 PK STRL BLUE (TOWEL DISPOSABLE) ×3 IMPLANT

## 2017-12-20 NOTE — Progress Notes (Signed)
Pharmacy Antibiotic Note  Nicholas Hanson is a 47 y.o. male admitted on 12/19/2017 with need of empiric antibiotic coverage.  Pharmacy has been consulted for zosyn dosing.  Plan: Zosyn 3.375g IV q8h (4 hour infusion).  Height: 5\' 10"  (177.8 cm) Weight: 185 lb 13.6 oz (84.3 kg) IBW/kg (Calculated) : 73  Temp (24hrs), Avg:98.2 F (36.8 C), Min:98.1 F (36.7 C), Max:98.3 F (36.8 C)  Recent Labs  Lab 12/19/17 2054 12/19/17 2107  WBC 11.5*  --   CREATININE 0.84 0.80    Estimated Creatinine Clearance: 117.9 mL/min (by C-G formula based on SCr of 0.8 mg/dL).    No Known Allergies  Antimicrobials this admission: Zosyn 12/20/2017 >>   Dose adjustments this admission: -  Microbiology results: pending  Thank you for allowing pharmacy to be a part of this patient's care.  Nicholas Hanson, Nicholas Hanson 12/20/2017 5:31 AM

## 2017-12-20 NOTE — Transfer of Care (Signed)
Immediate Anesthesia Transfer of Care Note  Patient: Nicholas Hanson  Procedure(s) Performed: IRRIGATION AND DEBRIDEMENT PERIRECTAL ABSCESS (N/A ) RECTAL EXAM UNDER ANESTHESIA (N/A )  Patient Location: PACU  Anesthesia Type:General  Level of Consciousness: sedated  Airway & Oxygen Therapy: Patient Spontanous Breathing and Patient connected to face mask oxygen, nasal trumpet placed right nares  Post-op Assessment: Report given to RN and Post -op Vital signs reviewed and stable  Post vital signs: Reviewed and stable  Last Vitals:  Vitals:   12/20/17 0620 12/20/17 0633  BP: 124/62 99/68  Pulse: 69 74  Resp: 18 18  Temp: 36.9 C 36.8 C  SpO2: 99% 99%    Last Pain:  Vitals:   12/20/17 0900  TempSrc:   PainSc: 5          Complications: No apparent anesthesia complications

## 2017-12-20 NOTE — Anesthesia Postprocedure Evaluation (Signed)
Anesthesia Post Note  Patient: Nicholas Hanson  Procedure(s) Performed: IRRIGATION AND DEBRIDEMENT PERIRECTAL ABSCESS (N/A ) RECTAL EXAM UNDER ANESTHESIA (N/A )     Patient location during evaluation: PACU Anesthesia Type: General Level of consciousness: awake and alert Pain management: pain level controlled Vital Signs Assessment: post-procedure vital signs reviewed and stable Respiratory status: spontaneous breathing, nonlabored ventilation, respiratory function stable and patient connected to nasal cannula oxygen Cardiovascular status: blood pressure returned to baseline and stable Postop Assessment: no apparent nausea or vomiting Anesthetic complications: no    Last Vitals:  Vitals:   12/20/17 1145 12/20/17 1150  BP: 94/62 (!) 96/59  Pulse: 75 77  Resp: 14 14  Temp:    SpO2: 96% 95%    Last Pain:  Vitals:   12/20/17 1115  TempSrc:   PainSc: Asleep                 Debbrah Sampedro S

## 2017-12-20 NOTE — Anesthesia Procedure Notes (Signed)
Procedure Name: LMA Insertion Date/Time: 12/20/2017 10:02 AM Performed by: Jhonnie GarnerMarshall, Ilham Roughton M, CRNA Pre-anesthesia Checklist: Patient identified, Emergency Drugs available, Suction available and Patient being monitored Patient Re-evaluated:Patient Re-evaluated prior to induction Oxygen Delivery Method: Circle system utilized Preoxygenation: Pre-oxygenation with 100% oxygen Induction Type: IV induction Ventilation: Mask ventilation without difficulty LMA: LMA inserted LMA Size: 4.0 Number of attempts: 1 Placement Confirmation: positive ETCO2 and breath sounds checked- equal and bilateral Tube secured with: Tape Dental Injury: Teeth and Oropharynx as per pre-operative assessment

## 2017-12-20 NOTE — Interval H&P Note (Signed)
History and Physical Interval Note:  12/20/2017 9:34 AM  Nicholas Booterek Hanson  has presented today for surgery, with the diagnosis of peri-rectal abscess  The various methods of treatment have been discussed with the patient and family. After consideration of risks, benefits and other options for treatment, the patient has consented to  Procedure(s) with comments: IRRIGATION AND DEBRIDEMENT PERIRECTAL ABSCESS (N/A) RECTAL EXAM UNDER ANESTHESIA (N/A) - high lithotomy as a surgical intervention .  The patient's history has been reviewed, patient examined, no change in status, stable for surgery.  I have reviewed the patient's chart and labs.  Questions were answered to the patient's satisfaction.     Almond LintFaera Spero Gunnels

## 2017-12-20 NOTE — Op Note (Signed)
Incision and Drainage Perirectal Abscess  Pre-operative Diagnosis: posterior perirectal abscess   Post-operative Diagnosis: same   Indications: multiple prior surgeries in this region, now with several days of worsening pain and swelling.  Prior scarring makes it difficult for good exam or bedside I&D  Anesthesia: General   Procedure Details  The procedure, risks and complications have been discussed in detail (including, infection, bleeding, need for additional procedures) with the patient, and the patient has signed consent to the procedure. The patient was informed that the wound would be left open.  The patient was taken to OR 4 and general anesthesia was induced. The patient was placed into lithotomy position.  The skin was sterilely prepped and draped over the affected area in the usual fashion. Time out was performed according to the surgical safety checklist.   Local anesthetic was administered around the posterior rectum.  A digital rectal exam was performed.  The area that felt fluctuant that corresponded with abscess on CT was sounded with a needle and purulent drainage was obtained.  I&D with a #15 blade was performed on the perirectal region.  Copious pus was seen.  The abscess tracked from the left posterior region across the midline posteriorly and extended anteriorly on the left.  No significant abscess was found on the right side, and the abscess did not truly make a horseshoe.  Anoscopy was performed.  Internal hemorrhoids were present.  A small area on the left posterolateral aspect of the anal verge was suspicious for a fistula tract, but not definitive.    Cultures were sent. Additional skin was debrided around the opening. Purulent drainage was present. Saline was used to irrigate the cavity. The wound was packed with 1/2 " NuGauze.  The patient was awakened from anesthesia and taken to the PACU in stable condition.   Findings:  Abscess predominantly on the left and posterior  with slight extension to the right.    EBL: minimal  Drains: None   Condition: Tolerated procedure well   Complications:  none known.

## 2017-12-20 NOTE — Anesthesia Preprocedure Evaluation (Signed)
Anesthesia Evaluation  Patient identified by MRN, date of birth, ID band Patient awake    Reviewed: Allergy & Precautions, NPO status , Patient's Chart, lab work & pertinent test results  Airway Mallampati: II  TM Distance: >3 FB Neck ROM: Full    Dental no notable dental hx.    Pulmonary Current Smoker,    Pulmonary exam normal breath sounds clear to auscultation       Cardiovascular negative cardio ROS Normal cardiovascular exam Rhythm:Regular Rate:Normal     Neuro/Psych  Neuromuscular disease negative psych ROS   GI/Hepatic negative GI ROS, Neg liver ROS,   Endo/Other  negative endocrine ROS  Renal/GU negative Renal ROS  negative genitourinary   Musculoskeletal negative musculoskeletal ROS (+)   Abdominal   Peds negative pediatric ROS (+)  Hematology negative hematology ROS (+)   Anesthesia Other Findings   Reproductive/Obstetrics negative OB ROS                             Anesthesia Physical Anesthesia Plan  ASA: II  Anesthesia Plan: General   Post-op Pain Management:    Induction: Intravenous  PONV Risk Score and Plan: 2 and Ondansetron and Dexamethasone  Airway Management Planned: Oral ETT and LMA  Additional Equipment:   Intra-op Plan:   Post-operative Plan: Extubation in OR  Informed Consent: I have reviewed the patients History and Physical, chart, labs and discussed the procedure including the risks, benefits and alternatives for the proposed anesthesia with the patient or authorized representative who has indicated his/her understanding and acceptance.   Dental advisory given  Plan Discussed with: CRNA and Surgeon  Anesthesia Plan Comments:         Anesthesia Quick Evaluation
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
## 2017-12-20 NOTE — Progress Notes (Signed)
Instructed patient by demonstration how to use Sitz bath. Patient returned demonstration and used Sitz bath correctly. Dressings (4X4 gauze, ABD pads) reapplied to perianal area and held there with mesh panties. Patient stated Sitz bath provided comfort to op site. Lina SarBeth Ivana Nicastro, RN

## 2017-12-21 LAB — RPR: RPR Ser Ql: NONREACTIVE

## 2017-12-21 LAB — HIV ANTIBODY (ROUTINE TESTING W REFLEX): HIV Screen 4th Generation wRfx: NONREACTIVE

## 2017-12-21 MED ORDER — WITCH HAZEL-GLYCERIN EX PADS: 1.0000 "application " | MEDICATED_PAD | CUTANEOUS | Status: DC | PRN

## 2017-12-21 MED ORDER — HYDROCORTISONE 2.5 % RE CREA: 1.0000 "application " | TOPICAL_CREAM | Freq: Four times a day (QID) | RECTAL | Status: DC | PRN

## 2017-12-21 MED ORDER — OXYCODONE HCL 5 MG PO TABS: 5.0000 mg | ORAL_TABLET | ORAL | Status: DC | PRN

## 2017-12-21 MED ORDER — AMOXICILLIN-POT CLAVULANATE 875-125 MG PO TABS: 1.0000 | ORAL_TABLET | Freq: Two times a day (BID) | ORAL | 0 refills | Status: DC

## 2017-12-21 MED ORDER — ALUM & MAG HYDROXIDE-SIMETH 200-200-20 MG/5ML PO SUSP: 30.0000 mL | Freq: Four times a day (QID) | ORAL | Status: DC | PRN

## 2017-12-21 NOTE — Discharge Instructions (Addendum)
Take antibiotics as prescribed Perform sitz baths at least 2 times daily Shower with wound open Follow up as scheduled. Call with any concerns (ie fever, chills, increased pain at surgical site, nausea, vomiting...)  Disposable Sitz Bath A disposable sitz bath is a plastic basin that fits over the toilet. A bag is hung above the toilet, and the bag is connected to a tube that opens into the basin. The bag is filled with warm water that flows into the basin through the tube. A sitz bath can be used to help relieve symptoms, clean, and promote healing in the genital and anal areas, as well as in the lower abdomen and buttocks. What are the risks? Sitz baths are generally very safe. It is possible for the skin between the genitals and the anus (perineum) to become infected, but this is rare. You can avoid this by cleaning your sitz bath supplies thoroughly. How to use a disposable sitz bath 1. Close the clamp on the tube. Make sure the clamp is closed tightly to prevent leakage. 2. Fill the sitz bath basin and the plastic bag with warm water. The water should be warm enough to be comfortable, but not hot. 3. Raise the toilet seat and place the filled basin on the toilet. Make sure the overflow opening is facing toward the back of the toilet. ? If you prefer, you may place the empty basin on the toilet first, and then use the plastic bag to fill the basin with warm water. 4. Hang the filled plastic bag overhead on a hook or towel rack close to the toilet. The bag should be higher than the toilet so that the water will flow down through the tube. 5. Attach the tube to the opening on the basin. Make sure that the tube is attached to the basin tightly to prevent leakage. 6. Sit on the basin and release the clamp. This will allow warm water to flow into the basin and flush the area around your genitals and anus. 7. Remain sitting on the basin for about 15-20 minutes, or as long as told by your health care  provider. 8. Stand up and gently pat your skin dry. If directed, apply clean bandages (dressings) to the affected area as told by your health care provider. 9. Carefully remove the basin from the toilet seat and tip the basin into the toilet to empty any remaining water. Empty any remaining water from the plastic bag into the toilet. Then, flush the toilet. 10. Wash the basin with warm water and soap. Let the basin air dry in the sink. You should also let the plastic bag and the tubing air dry. 11. Store the basin, tubing, and plastic bag in a clean, dry area. 12. Wash your hands with soap and water. If soap and water are not available, use hand sanitizer. Contact a health care provider if:  You have symptoms that get worse instead of better.  You develop new skin irritation, redness, or swelling around your genitals or anus. This information is not intended to replace advice given to you by your health care provider. Make sure you discuss any questions you have with your health care provider. Document Released: 04/05/2012 Document Revised: 03/12/2016 Document Reviewed: 08/25/2015 Elsevier Interactive Patient Education  [employer]2018 [employer]

## 2017-12-21 NOTE — Progress Notes (Signed)
Discharge and medication instructions reviewed with patient. Questions answered. Patient denies further questions. One prescriptions given to patient. Patient has called someone to drive him home. IV DC's and patient getting shower and sitz bath. Lina SarBeth Lachlan Pelto, RN

## 2017-12-21 NOTE — Discharge Summary (Signed)
[facility] Discharge Summary   Patient ID: Nicholas Hanson MRN: [id number] DOB/AGE: September 01, 1971 47 y.o.  Admit date: 12/19/2017 Discharge date: 12/21/2017  Admitting Diagnosis: Perirectal abscess  Discharge Diagnosis Patient Active Problem List   Diagnosis Date Noted  . History of subarachnoid hemorrhage ~ 2010 12/20/2017  . 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 Peri-rectal abscess 12/20/2017  . History of suicide attempt 12/19/2017  . Perirectal abscess-recurrent 12/19/2017  . History of alcohol abuse - abstinent since 2001 12/19/2017  . Erectile dysfunction 08/30/2015  . High degree of dependence on nicotine 08/29/2015  . Cocaine abuse (HCC) 08/21/2015  . Intermittent explosive disorder 07/25/2015  . 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 Tobacco abuse 01/06/2012    Consultants None  Imaging: Ct Pelvis W Contrast  Result Date: 12/19/2017 CLINICAL DATA:  Possible hemorrhoids pelvic pain with sitting EXAM: CT PELVIS WITH CONTRAST TECHNIQUE: Multidetector CT imaging of the pelvis was performed using the standard protocol following the bolus administration of intravenous contrast. CONTRAST:  100mL ISOVUE-300 IOPAMIDOL (ISOVUE-300) INJECTION 61% COMPARISON:  None. FINDINGS: Urinary Tract:  No abnormality visualized. Bowel: Unremarkable visualized pelvic bowel loops. Within the posterior perianal region is an irregularly-shaped mildly rim enhancing fluid collection measuring approximately 2.5 cm transverse by 2.8 cm AP by 1.5 cm craniocaudad. Fluid collection is seen to the right and left of midline. There is surrounding soft tissue thickening. Vascular/Lymphatic: Mild aortic atherosclerosis. No aneurysmal dilatation. No significantly enlarged pelvic nodes Reproductive:  No mass or other significant abnormality Other: Negative for free pelvic fluid. Hyperdense material between the gluteal clefts. Linear soft  tissue densities emanating from the posterior perianal region bilaterally and leading toward the skin surface of the medial gluteal folds. Musculoskeletal: No suspicious bone lesions identified. IMPRESSION: Irregular shaped mildly rim enhancing fluid collection in the posterior perianal region measuring 2.5 x 2.8 x 1.5 cm, suspicious for small multiloculated perianal abscess. There is associated soft tissue thickening present. Linear soft tissue densities extending from the posterior perianal region bilaterally to the skin surface of the right and left medial gluteal fold suspect for fistula, recommend correlation with direct inspection. Electronically Signed   By: Jasmine PangKim  Fujinaga M.D.   On: 12/19/2017 22:59    Procedures Dr. Donell BeersByerly (12/20/17) - Incision and drainage perirectal abscess  Hospital Course:  Nicholas Hanson is a 47yo male who [facility] with 3 days of worsening perirectal pain.  Workup showed a left-sided fluctuant perirectal abscess.  Patient was admitted and underwent procedure listed above.  Tolerated procedure well and was transferred to the floor.  Packing removed POD1 and patient started on sitz baths. On POD1, the patient was voiding well, tolerating diet, ambulating well, pain well controlled, vital signs stable and felt stable for discharge home.  He will go home with 5 days of augmentin, and follow up in our office in 1 week. Patient knows to call with questions or concerns.     Physical Exam: General:  Alert, NAD, pleasant, comfortable Pulm: effort normal Abd:  Soft, ND, NT GU: left perirectal abscess s/p I&D with packing already removed, no fluctuance, trace serosanguinous drainage  Allergies as of 12/21/2017   No Known Allergies     Medication List    TAKE these medications   amoxicillin-clavulanate 875-125 MG tablet Commonly known as:  AUGMENTIN Take 1 tablet by mouth 2 (two) times daily.        Follow-up Information    [facility] [place] [place]  [place]/[facility]/2017.   Specialty:  General Surgery Why:  Your appointment is 12/28/17 at 11:45AM. Please arrive 30 minutes prior to your appointment to check in and fill out paperwork. Bring photo ID and insurance information. Contact information: [address] [place] [address] [phone]       [facility]. Call.   Why:  call to find a primary care physician Contact information: [address] [place] [place] [address] [phone]          Signed: Franne Forts, [facility] 12/21/2017, 12:29 PM Pager: [phone] Consults: [phone] Mon-Fri 7:00 am-4:30 pm Sat-Sun 7:00 am-11:30 am

## 2017-12-25 LAB — AEROBIC/ANAEROBIC CULTURE (SURGICAL/DEEP WOUND)

## 2017-12-25 LAB — AEROBIC/ANAEROBIC CULTURE W GRAM STAIN (SURGICAL/DEEP WOUND)

## 2018-02-23 ENCOUNTER — Observation Stay (HOSPITAL_COMMUNITY)
Admission: EM | Admit: 2018-02-23 | Discharge: 2018-02-25 | Disposition: A | Discharge status: Home / Self Care | Payer: Medicare HMO | Attending: Family Medicine | Admitting: Family Medicine

## 2018-02-23 ENCOUNTER — Other Ambulatory Visit: Payer: Self-pay

## 2018-02-23 ENCOUNTER — Observation Stay (HOSPITAL_COMMUNITY): Payer: Medicare HMO

## 2018-02-23 ENCOUNTER — Emergency Department (HOSPITAL_COMMUNITY): Payer: Medicare HMO

## 2018-02-23 DIAGNOSIS — S299XXA Unspecified injury of thorax, initial encounter: Secondary | ICD-10-CM | POA: Diagnosis not present

## 2018-02-23 DIAGNOSIS — M6282 Rhabdomyolysis: Secondary | ICD-10-CM | POA: Diagnosis not present

## 2018-02-23 DIAGNOSIS — F17210 Nicotine dependence, cigarettes, uncomplicated: Secondary | ICD-10-CM | POA: Insufficient documentation

## 2018-02-23 DIAGNOSIS — F319 Bipolar disorder, unspecified: Secondary | ICD-10-CM | POA: Insufficient documentation

## 2018-02-23 DIAGNOSIS — F4310 Post-traumatic stress disorder, unspecified: Secondary | ICD-10-CM | POA: Insufficient documentation

## 2018-02-23 DIAGNOSIS — G7111 Myotonic muscular dystrophy: Secondary | ICD-10-CM | POA: Diagnosis not present

## 2018-02-23 DIAGNOSIS — F1910 Other psychoactive substance abuse, uncomplicated: Secondary | ICD-10-CM | POA: Diagnosis not present

## 2018-02-23 DIAGNOSIS — Z5900 Homelessness unspecified: Secondary | ICD-10-CM

## 2018-02-23 DIAGNOSIS — M5137 Other intervertebral disc degeneration, lumbosacral region: Secondary | ICD-10-CM | POA: Diagnosis not present

## 2018-02-23 DIAGNOSIS — N179 Acute kidney failure, unspecified: Secondary | ICD-10-CM | POA: Insufficient documentation

## 2018-02-23 DIAGNOSIS — M545 Low back pain: Secondary | ICD-10-CM | POA: Diagnosis not present

## 2018-02-23 DIAGNOSIS — F1410 Cocaine abuse, uncomplicated: Secondary | ICD-10-CM | POA: Insufficient documentation

## 2018-02-23 DIAGNOSIS — F1911 Other psychoactive substance abuse, in remission: Secondary | ICD-10-CM | POA: Diagnosis not present

## 2018-02-23 DIAGNOSIS — I499 Cardiac arrhythmia, unspecified: Secondary | ICD-10-CM | POA: Diagnosis not present

## 2018-02-23 DIAGNOSIS — Z79899 Other long term (current) drug therapy: Secondary | ICD-10-CM | POA: Diagnosis not present

## 2018-02-23 DIAGNOSIS — M542 Cervicalgia: Secondary | ICD-10-CM | POA: Diagnosis not present

## 2018-02-23 DIAGNOSIS — R0902 Hypoxemia: Secondary | ICD-10-CM | POA: Diagnosis not present

## 2018-02-23 DIAGNOSIS — R269 Unspecified abnormalities of gait and mobility: Secondary | ICD-10-CM | POA: Diagnosis not present

## 2018-02-23 LAB — BASIC METABOLIC PANEL
ANION GAP: 14 (ref 5–15)
BUN: 23 mg/dL — ABNORMAL HIGH (ref 6–20)
CALCIUM: 9.8 mg/dL (ref 8.9–10.3)
CHLORIDE: 104 mmol/L (ref 101–111)
CO2: 19 mmol/L — AB (ref 22–32)
CREATININE: 1.88 mg/dL — AB (ref 0.61–1.24)
GFR calc non Af Amer: 41 mL/min — ABNORMAL LOW (ref >60)
GFR, EST AFRICAN AMERICAN: 47 mL/min — AB (ref >60)
Glucose, Bld: 124 mg/dL — ABNORMAL HIGH (ref 65–99)
Potassium: 3.8 mmol/L (ref 3.5–5.1)
SODIUM: 137 mmol/L (ref 135–145)

## 2018-02-23 LAB — URINALYSIS, ROUTINE W REFLEX MICROSCOPIC
Bilirubin Urine: NEGATIVE
Glucose, UA: NEGATIVE mg/dL
Ketones, ur: 5 mg/dL — AB
LEUKOCYTES UA: NEGATIVE
Nitrite: NEGATIVE
PH: 6 (ref 5.0–8.0)
Protein, ur: NEGATIVE mg/dL
SPECIFIC GRAVITY, URINE: 1.009 (ref 1.005–1.030)

## 2018-02-23 LAB — CBC WITH DIFFERENTIAL/PLATELET
BASOS ABS: 0 10*3/uL (ref 0.0–0.1)
BASOS PCT: 0 %
EOS ABS: 0 10*3/uL (ref 0.0–0.7)
Eosinophils Relative: 0 %
HEMATOCRIT: 47.3 % (ref 39.0–52.0)
HEMOGLOBIN: 15.8 g/dL (ref 13.0–17.0)
Lymphocytes Relative: 21 %
Lymphs Abs: 2.1 10*3/uL (ref 0.7–4.0)
MCH: 28.7 pg (ref 26.0–34.0)
MCHC: 33.4 g/dL (ref 30.0–36.0)
MCV: 86 fL (ref 78.0–100.0)
MONOS PCT: 8 %
Monocytes Absolute: 0.8 10*3/uL (ref 0.1–1.0)
NEUTROS ABS: 7.2 10*3/uL (ref 1.7–7.7)
NEUTROS PCT: 71 %
Platelets: 308 10*3/uL (ref 150–400)
RBC: 5.5 MIL/uL (ref 4.22–5.81)
RDW: 14.7 % (ref 11.5–15.5)
WBC: 10.1 10*3/uL (ref 4.0–10.5)

## 2018-02-23 LAB — RAPID URINE DRUG SCREEN, HOSP PERFORMED
AMPHETAMINES: NOT DETECTED
BARBITURATES: NOT DETECTED
Benzodiazepines: NOT DETECTED
Cocaine: POSITIVE — AB
Opiates: NOT DETECTED
TETRAHYDROCANNABINOL: POSITIVE — AB

## 2018-02-23 LAB — ETHANOL: Alcohol, Ethyl (B): <10 mg/dL (ref <10)

## 2018-02-23 LAB — CK: Total CK: 3951 U/L — ABNORMAL HIGH (ref 49–397)

## 2018-02-23 MED ORDER — ENSURE ENLIVE PO LIQD
237.0000 mL | Freq: Two times a day (BID) | ORAL | Status: DC
  Administered 2018-02-24 (×2): 237 mL via ORAL

## 2018-02-23 MED ORDER — ONDANSETRON HCL 4 MG PO TABS: 4.0000 mg | ORAL_TABLET | Freq: Four times a day (QID) | ORAL | Status: DC | PRN

## 2018-02-23 MED ORDER — ONDANSETRON HCL 4 MG/2ML IJ SOLN: 4.0000 mg | Freq: Four times a day (QID) | INTRAMUSCULAR | Status: DC | PRN

## 2018-02-23 MED ORDER — ALBUTEROL SULFATE (2.5 MG/3ML) 0.083% IN NEBU: 2.5000 mg | INHALATION_SOLUTION | Freq: Four times a day (QID) | RESPIRATORY_TRACT | Status: DC | PRN

## 2018-02-23 MED ORDER — VITAMIN B-1 100 MG PO TABS
100.0000 mg | ORAL_TABLET | Freq: Every day | ORAL | Status: DC
  Administered 2018-02-23 – 2018-02-25 (×3): 100 mg via ORAL
  Filled 2018-02-23 (×3): qty 1

## 2018-02-23 MED ORDER — LORAZEPAM 2 MG/ML IJ SOLN: 1.0000 mg | Freq: Four times a day (QID) | INTRAMUSCULAR | Status: DC | PRN

## 2018-02-23 MED ORDER — LORAZEPAM 1 MG PO TABS: 1.0000 mg | ORAL_TABLET | Freq: Four times a day (QID) | ORAL | Status: DC | PRN

## 2018-02-23 MED ORDER — ACETAMINOPHEN 650 MG RE SUPP: 650.0000 mg | Freq: Four times a day (QID) | RECTAL | Status: DC | PRN

## 2018-02-23 MED ORDER — ADULT MULTIVITAMIN W/MINERALS CH
1.0000 | ORAL_TABLET | Freq: Every day | ORAL | Status: DC
  Administered 2018-02-23 – 2018-02-25 (×3): 1 via ORAL
  Filled 2018-02-23 (×3): qty 1

## 2018-02-23 MED ORDER — SODIUM CHLORIDE 0.9 % IV SOLN
INTRAVENOUS | Status: DC
  Administered 2018-02-23 – 2018-02-24 (×3): via INTRAVENOUS

## 2018-02-23 MED ORDER — ENOXAPARIN SODIUM 40 MG/0.4ML ~~LOC~~ SOLN
40.0000 mg | Freq: Every day | SUBCUTANEOUS | Status: DC
  Administered 2018-02-23 – 2018-02-24 (×2): 40 mg via SUBCUTANEOUS
  Filled 2018-02-23 (×2): qty 0.4

## 2018-02-23 MED ORDER — SODIUM CHLORIDE 0.9 % IV BOLUS
1000.0000 mL | Freq: Once | INTRAVENOUS | Status: AC
  Administered 2018-02-23: 1000 mL via INTRAVENOUS

## 2018-02-23 MED ORDER — THIAMINE HCL 100 MG/ML IJ SOLN: 100.0000 mg | Freq: Every day | INTRAMUSCULAR | Status: DC

## 2018-02-23 MED ORDER — ACETAMINOPHEN 325 MG PO TABS: 650.0000 mg | ORAL_TABLET | Freq: Four times a day (QID) | ORAL | Status: DC | PRN

## 2018-02-23 MED ORDER — FOLIC ACID 1 MG PO TABS
1.0000 mg | ORAL_TABLET | Freq: Every day | ORAL | Status: DC
  Administered 2018-02-23 – 2018-02-25 (×3): 1 mg via ORAL
  Filled 2018-02-23 (×3): qty 1

## 2018-02-23 NOTE — ED Notes (Signed)
Pt sister called and expressed that she has information about pt hx.

## 2018-02-23 NOTE — ED Notes (Signed)
Pt sister called to leave contact information. She expressed that she would like to be contacted with any questions.  Her name is "Annice Pih"  [phone]
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
## 2018-02-23 NOTE — Progress Notes (Signed)
Attempted to do nursing admission history. Introduced myself to patient and told him that I needed to complete a nursing history on him. He moaned and started wiggling around in the bed and pulled his covers up. Unable to complete history as patient refuses to answer questions. Briscoe Burns BSN, RN-BC Admissions RN 02/23/2018 9:42 PM
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
## 2018-02-23 NOTE — ED Notes (Signed)
Pts shoes and clothing were removed and scrubs were put on with assistance. Pt had three ear rings and a cross necklace removed and put inside his pt belonging bag along with his clothing.

## 2018-02-23 NOTE — Progress Notes (Signed)
[facility] ED CSW received consult. Pt reports homelessness. Pt is still undergoing medical workup. CSW will provide homeless resources upon discharge.   [address]ine [address], [facility]  [phone]

## 2018-02-23 NOTE — ED Notes (Signed)
Suitcases by nurse station
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
## 2018-02-24 ENCOUNTER — Encounter (HOSPITAL_COMMUNITY): Payer: Self-pay | Admitting: *Deleted

## 2018-02-24 ENCOUNTER — Observation Stay (HOSPITAL_COMMUNITY): Payer: Medicare HMO

## 2018-02-24 DIAGNOSIS — Z5900 Homelessness unspecified: Secondary | ICD-10-CM

## 2018-02-24 DIAGNOSIS — F1910 Other psychoactive substance abuse, uncomplicated: Secondary | ICD-10-CM | POA: Diagnosis present

## 2018-02-24 DIAGNOSIS — N179 Acute kidney failure, unspecified: Secondary | ICD-10-CM | POA: Diagnosis not present

## 2018-02-24 DIAGNOSIS — M6282 Rhabdomyolysis: Secondary | ICD-10-CM | POA: Diagnosis not present

## 2018-02-24 LAB — CBC
HCT: 40.1 % (ref 39.0–52.0)
Hemoglobin: 12.7 g/dL — ABNORMAL LOW (ref 13.0–17.0)
MCH: 27.6 pg (ref 26.0–34.0)
MCHC: 31.7 g/dL (ref 30.0–36.0)
MCV: 87.2 fL (ref 78.0–100.0)
Platelets: 247 10*3/uL (ref 150–400)
RBC: 4.6 MIL/uL (ref 4.22–5.81)
RDW: 15.2 % (ref 11.5–15.5)
WBC: 7.1 10*3/uL (ref 4.0–10.5)

## 2018-02-24 LAB — BASIC METABOLIC PANEL
ANION GAP: 7 (ref 5–15)
BUN: 17 mg/dL (ref 6–20)
CALCIUM: 8.7 mg/dL — AB (ref 8.9–10.3)
CO2: 22 mmol/L (ref 22–32)
Chloride: 112 mmol/L — ABNORMAL HIGH (ref 101–111)
Creatinine, Ser: 1.18 mg/dL (ref 0.61–1.24)
GFR calc non Af Amer: >60 mL/min (ref >60)
Glucose, Bld: 94 mg/dL (ref 65–99)
POTASSIUM: 3.6 mmol/L (ref 3.5–5.1)
Sodium: 141 mmol/L (ref 135–145)

## 2018-02-24 LAB — CK: CK TOTAL: 3122 U/L — AB (ref 49–397)
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
## 2018-02-24 NOTE — Progress Notes (Signed)
Patient would like [place] to give his sister any information when she calls.

## 2018-02-24 NOTE — Care Management Obs Status (Signed)
MEDICARE OBSERVATION STATUS NOTIFICATION   Patient Details  Name: Nicholas Hanson MRN: [id number] Date of Birth: 11-05-1970   Medicare Observation Status Notification Given:  Yes    Golda Acre, RN 02/24/2018, 11:52 AM
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
## 2018-02-25 DIAGNOSIS — M6282 Rhabdomyolysis: Secondary | ICD-10-CM | POA: Diagnosis not present

## 2018-02-25 LAB — CK: CK TOTAL: 2042 U/L — AB (ref 49–397)

## 2018-02-25 MED ORDER — ACETAMINOPHEN 500 MG PO TABS: 1000.0000 mg | ORAL_TABLET | Freq: Four times a day (QID) | ORAL | 0 refills | Status: AC | PRN
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
## 2018-02-25 NOTE — Progress Notes (Signed)
CSW assisted patient with calling Shelter in the area. Unfortunately at this time [facility] is at capacity.   CSW spoke with Tobi Bastos at [facility], she states they have vacancies for shelter at this time.   She reports the patient will need be at facility by 5:30pm.   CSW informed the patient/ he is agreeable to go to shelter, understanding the bed is not guaranteed but first come first serve. Patient can stay at the shelter for 90 days.   CSW provided patient with a [place] bus pass for transportation needs.   Nurse informed.   Vivi Barrack, Theresia Majors, MSW Clinical Social Worker  [phone] 02/25/2018  11:25 AM

## 2018-04-11 DIAGNOSIS — R918 Other nonspecific abnormal finding of lung field: Secondary | ICD-10-CM | POA: Diagnosis not present

## 2018-04-11 DIAGNOSIS — R05 Cough: Secondary | ICD-10-CM | POA: Diagnosis not present

## 2018-04-11 DIAGNOSIS — G4489 Other headache syndrome: Secondary | ICD-10-CM | POA: Diagnosis not present

## 2018-04-11 DIAGNOSIS — J9811 Atelectasis: Secondary | ICD-10-CM | POA: Diagnosis not present

## 2018-04-11 DIAGNOSIS — J189 Pneumonia, unspecified organism: Secondary | ICD-10-CM | POA: Diagnosis not present

## 2018-04-11 DIAGNOSIS — J029 Acute pharyngitis, unspecified: Secondary | ICD-10-CM | POA: Diagnosis not present

## 2018-06-20 DIAGNOSIS — L0231 Cutaneous abscess of buttock: Secondary | ICD-10-CM | POA: Diagnosis not present

## 2018-06-20 DIAGNOSIS — R0902 Hypoxemia: Secondary | ICD-10-CM | POA: Diagnosis not present

## 2018-06-20 DIAGNOSIS — M5489 Other dorsalgia: Secondary | ICD-10-CM | POA: Diagnosis not present

## 2018-06-20 DIAGNOSIS — R52 Pain, unspecified: Secondary | ICD-10-CM | POA: Diagnosis not present

## 2018-06-20 DIAGNOSIS — I10 Essential (primary) hypertension: Secondary | ICD-10-CM | POA: Diagnosis not present

## 2018-09-06 IMAGING — CR DG LUMBAR SPINE COMPLETE 4+V
5 series · 5 of 5 positions shown · non-contrast
Comparison: None.

CLINICAL DATA: Fall with low back pain.

EXAM:
LUMBAR SPINE - COMPLETE 4+ VIEW

[t lumbar spine ap]
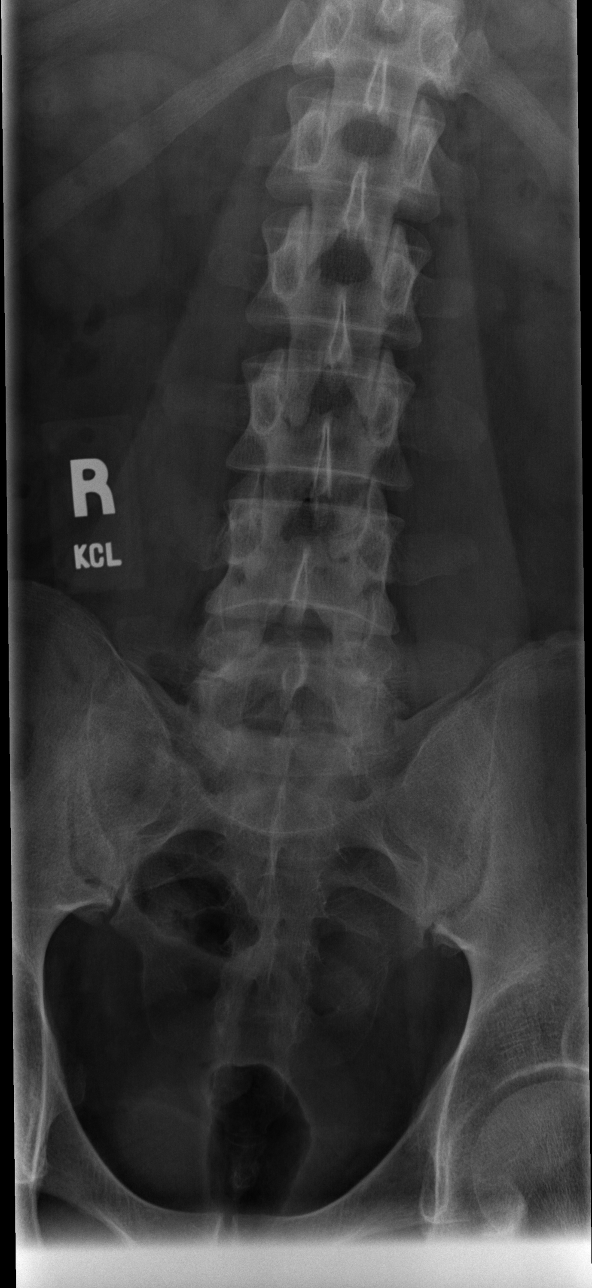

[t lumbar spine obl (1 of 2)]
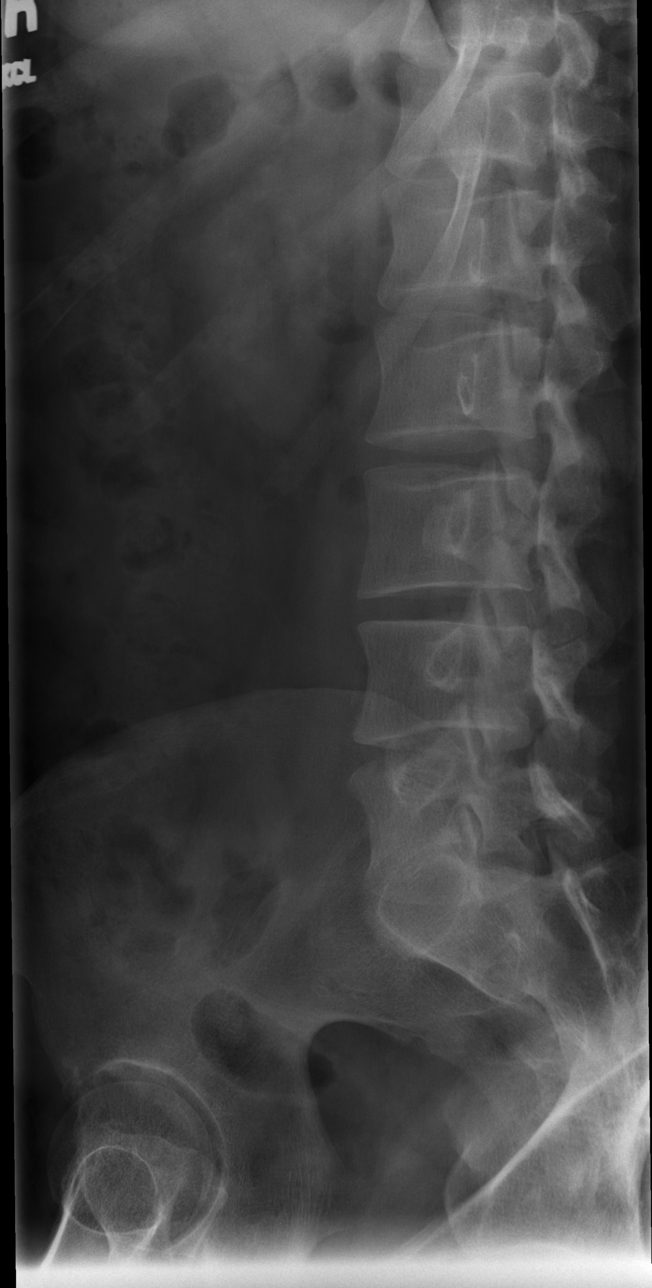

[t lumbar spine obl (2 of 2)]
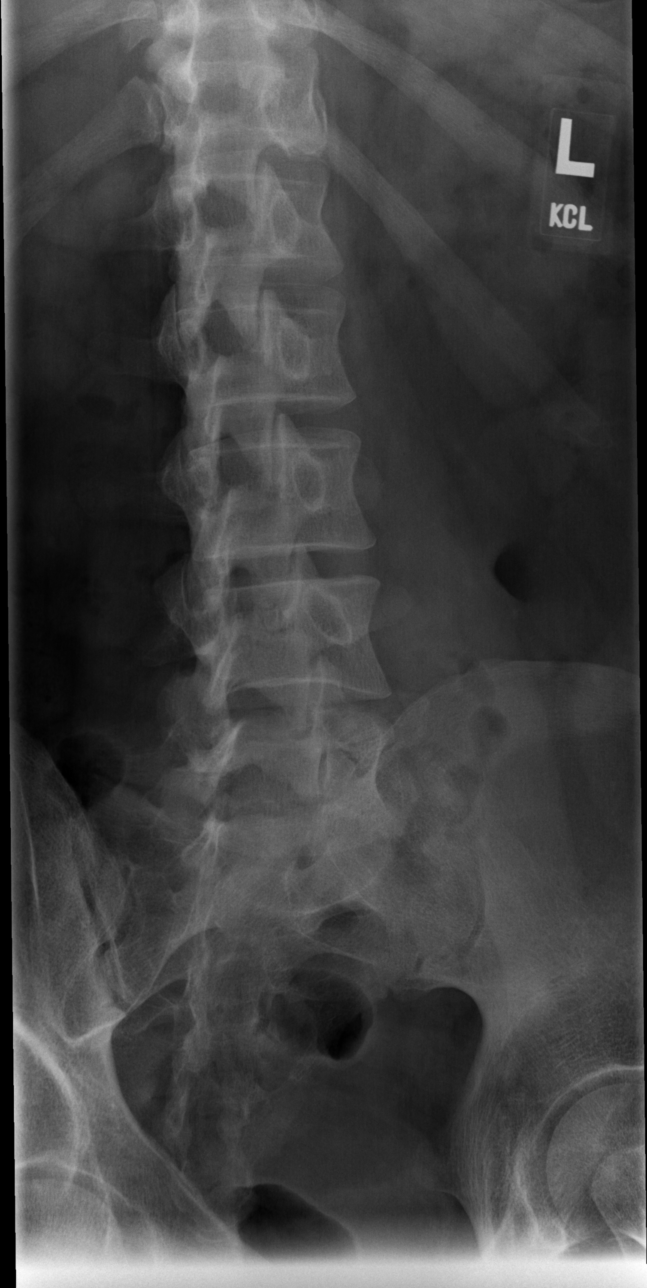

[t lumbar spine lat]
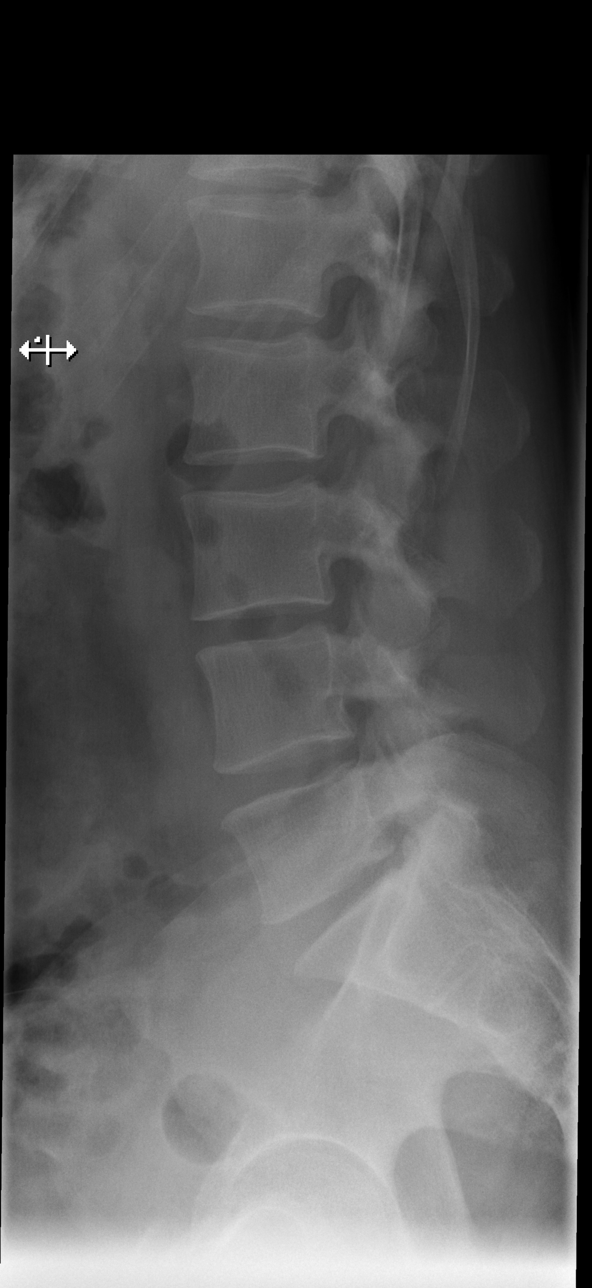

[t lumbar l-5 s-1 spot]
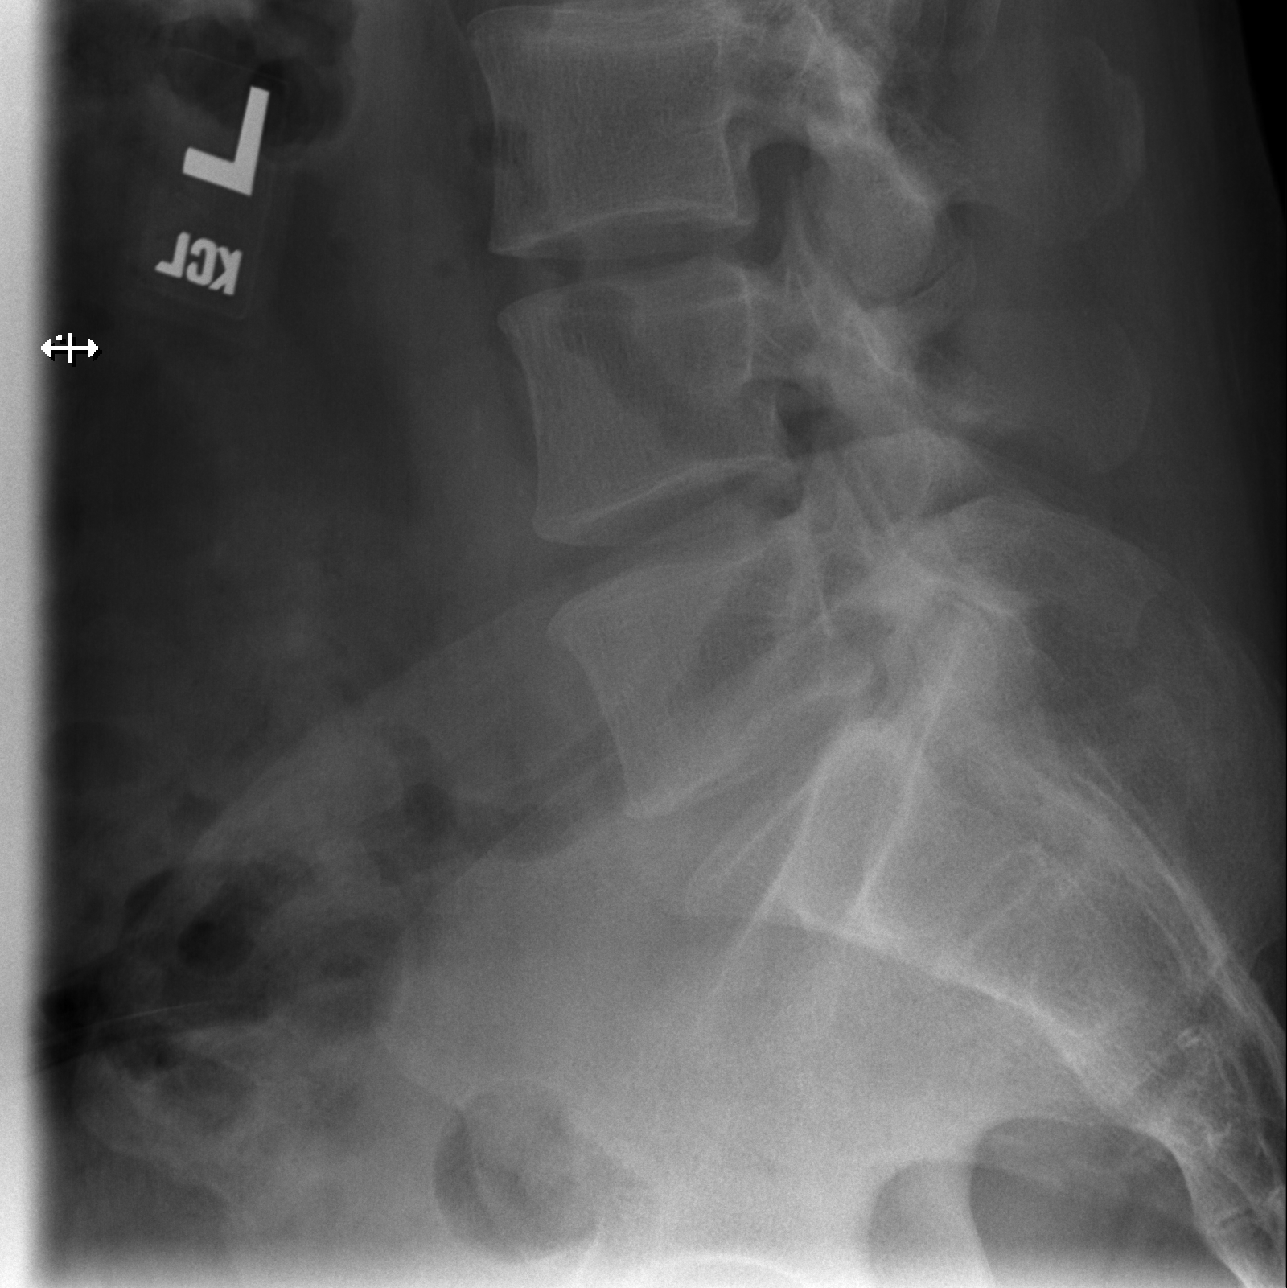

[5 of 5 positions shown; findings below may reference images not displayed]

FINDINGS: No fracture or subluxation identified. There is mild disc space
narrowing and facet hypertrophy at L5-S1. Other disc space heights
are normal. No bony lesions identified.
IMPRESSION: No acute findings.  Mild degenerative disc disease at L5-S1.

## 2018-09-08 DIAGNOSIS — R0902 Hypoxemia: Secondary | ICD-10-CM | POA: Diagnosis not present

## 2018-09-08 DIAGNOSIS — W19XXXA Unspecified fall, initial encounter: Secondary | ICD-10-CM | POA: Diagnosis not present

## 2018-09-08 DIAGNOSIS — B349 Viral infection, unspecified: Secondary | ICD-10-CM | POA: Diagnosis not present

## 2018-09-08 DIAGNOSIS — R1111 Vomiting without nausea: Secondary | ICD-10-CM | POA: Diagnosis not present

## 2018-09-08 DIAGNOSIS — R05 Cough: Secondary | ICD-10-CM | POA: Diagnosis not present

## 2018-09-08 DIAGNOSIS — R112 Nausea with vomiting, unspecified: Secondary | ICD-10-CM | POA: Diagnosis not present

## 2018-09-08 DIAGNOSIS — R110 Nausea: Secondary | ICD-10-CM | POA: Diagnosis not present
